# Patient Record
Sex: Female | Born: 1995
Health system: Southern US, Community
[De-identification: ages and names within clinical notes are randomized; demographics above are authoritative.]

## PROBLEM LIST (undated history)

## (undated) DIAGNOSIS — E559 Vitamin D deficiency, unspecified: Secondary | ICD-10-CM

## (undated) DIAGNOSIS — T7840XA Allergy, unspecified, initial encounter: Secondary | ICD-10-CM

## (undated) DIAGNOSIS — F419 Anxiety disorder, unspecified: Secondary | ICD-10-CM

## (undated) DIAGNOSIS — J302 Other seasonal allergic rhinitis: Secondary | ICD-10-CM

## (undated) DIAGNOSIS — E611 Iron deficiency: Secondary | ICD-10-CM

## (undated) DIAGNOSIS — D649 Anemia, unspecified: Secondary | ICD-10-CM

## (undated) HISTORY — DX: Anxiety disorder, unspecified: F41.9

## (undated) HISTORY — PX: WISDOM TOOTH EXTRACTION: SHX21

## (undated) HISTORY — DX: Iron deficiency: E61.1

## (undated) HISTORY — DX: Allergy, unspecified, initial encounter: T78.40XA

## (undated) HISTORY — DX: Anemia, unspecified: D64.9

---
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
## 2014-06-09 ENCOUNTER — Emergency Department
Admit: 2014-06-09 | Disposition: A | Discharge status: Home / Self Care | Payer: Self-pay | Admitting: Emergency Medicine

## 2018-07-29 ENCOUNTER — Other Ambulatory Visit: Payer: Self-pay

## 2018-07-29 ENCOUNTER — Encounter: Payer: Self-pay | Admitting: Certified Nurse Midwife

## 2018-07-29 ENCOUNTER — Ambulatory Visit (INDEPENDENT_AMBULATORY_CARE_PROVIDER_SITE_OTHER): Payer: BC Managed Care – PPO | Admitting: Certified Nurse Midwife

## 2018-07-29 VITALS — BP 105/70 | HR 72 | Ht 64.0 in | Wt 139.2 lb

## 2018-07-29 DIAGNOSIS — Z124 Encounter for screening for malignant neoplasm of cervix: Secondary | ICD-10-CM

## 2018-07-29 DIAGNOSIS — Z01419 Encounter for gynecological examination (general) (routine) without abnormal findings: Secondary | ICD-10-CM

## 2018-07-29 NOTE — Progress Notes (Signed)
GYNECOLOGY ANNUAL PREVENTATIVE CARE ENCOUNTER NOTE  History:     Connie Martinez is a 23 y.o. No obstetric history on file. female here for a routine annual gynecologic exam.  Current complaints: Nuva ring sometimes falls out. [name].   Denies abnormal vaginal bleeding, discharge, pelvic pain, problems with intercourse or other gynecologic concerns.    Gynecologic History No LMP recorded. Contraception: NuvaRing vaginal inserts Last Pap: Never had   Last mammogram: n/a.   Obstetric History OB History  No obstetric history on file.    Past Medical History:  Diagnosis Date  . Seasonal allergies   . Vitamin D deficiency     Past Surgical History:  Procedure Laterality Date  . WISDOM TOOTH EXTRACTION      Current Outpatient Medications on File Prior to Visit  Medication Sig Dispense Refill  . cetirizine (ZYRTEC) 10 MG tablet Take 10 mg by mouth daily.    Marland Kitchen. dimenhyDRINATE (DRAMAMINE) 50 MG tablet Take 50 mg by mouth every 8 (eight) hours as needed (for nausea).    . ondansetron (ZOFRAN) 4 MG tablet Take 1 tablet (4 mg total) by mouth every 6 (six) hours. 12 tablet 0  . Vitamin D, Ergocalciferol, (DRISDOL) 50000 UNITS CAPS Take 50,000 Units by mouth every 7 (seven) days.     No current facility-administered medications on file prior to visit.     No Known Allergies  Social History:  reports that she has never smoked. She has never used smokeless tobacco. She reports that she does not drink alcohol or use drugs.  No family history on file.  The following portions of the patient's history were reviewed and updated as appropriate: allergies, current medications, past family history, past medical history, past social history, past surgical history and problem list.  Review of Systems Pertinent items noted in HPI and remainder of comprehensive ROS otherwise negative.  Physical Exam:  There were no vitals taken for this visit. CONSTITUTIONAL:  Well-developed, well-nourished female in no acute distress.  HENT:  Normocephalic, atraumatic, External right and left ear normal. Oropharynx is clear and moist EYES: Conjunctivae and EOM are normal. Pupils are equal, round, and reactive to light. No scleral icterus.  NECK: Normal range of motion, supple, no masses.  Normal thyroid.  SKIN: Skin is warm and dry. No rash noted. Not diaphoretic. No erythema. No pallor. MUSCULOSKELETAL: Normal range of motion. No tenderness.  No cyanosis, clubbing, or edema.  2+ distal pulses. NEUROLOGIC: Alert and oriented to person, place, and time. Normal reflexes, muscle tone coordination. No cranial nerve deficit noted. PSYCHIATRIC: Normal mood and affect. Normal behavior. Normal judgment and thought content. CARDIOVASCULAR: Normal heart rate noted, regular rhythm RESPIRATORY: Clear to auscultation bilaterally. Effort and breath sounds normal, no problems with respiration noted. BREASTS: Symmetric in size. No masses, skin changes, nipple drainage, or lymphadenopathy. ABDOMEN: Soft, normal bowel sounds, no distention noted.  No tenderness, rebound or guarding.  PELVIC: Normal appearing external genitalia; normal appearing vaginal mucosa and cervix.  No abnormal discharge noted.  Pap smear obtained.  Normal uterine size, no other palpable masses, no uterine or adnexal tenderness.   Assessment and Plan:   Annual well women's GYN exam  Will follow up results of pap smear and manage accordingly. Mammogram not indicated Labs: Lipid profile, TSH, pap smear Routine preventative health maintenance measures emphasized. Please refer to After Visit Summary for other counseling recommendations.    Reviewed BC options. Benefits, risks, and procedures for placement.  Follow up for IUD/nexplanon as desired.  Philip Aspen, CNM

## 2018-07-29 NOTE — Patient Instructions (Signed)
Preventive Care 18-39 Years, Female Preventive care refers to lifestyle choices and visits with your health care provider that can promote health and wellness. What does preventive care include?   A yearly physical exam. This is also called an annual well check.  Dental exams once or twice a year.  Routine eye exams. Ask your health care provider how often you should have your eyes checked.  Personal lifestyle choices, including: ? Daily care of your teeth and gums. ? Regular physical activity. ? Eating a healthy diet. ? Avoiding tobacco and drug use. ? Limiting alcohol use. ? Practicing safe sex. ? Taking vitamin and mineral supplements as recommended by your health care provider. What happens during an annual well check? The services and screenings done by your health care provider during your annual well check will depend on your age, overall health, lifestyle risk factors, and family history of disease. Counseling Your health care provider may ask you questions about your:  Alcohol use.  Tobacco use.  Drug use.  Emotional well-being.  Home and relationship well-being.  Sexual activity.  Eating habits.  Work and work environment.  Method of birth control.  Menstrual cycle.  Pregnancy history. Screening You may have the following tests or measurements:  Height, weight, and BMI.  Diabetes screening. This is done by checking your blood sugar (glucose) after you have not eaten for a while (fasting).  Blood pressure.  Lipid and cholesterol levels. These may be checked every 5 years starting at age 20.  Skin check.  Hepatitis C blood test.  Hepatitis B blood test.  Sexually transmitted disease (STD) testing.  BRCA-related cancer screening. This may be done if you have a family history of breast, ovarian, tubal, or peritoneal cancers.  Pelvic exam and Pap test. This may be done every 3 years starting at age 21. Starting at age 30, this may be done every 5  years if you have a Pap test in combination with an HPV test. Discuss your test results, treatment options, and if necessary, the need for more tests with your health care provider. Vaccines Your health care provider may recommend certain vaccines, such as:  Influenza vaccine. This is recommended every year.  Tetanus, diphtheria, and acellular pertussis (Tdap, Td) vaccine. You may need a Td booster every 10 years.  Varicella vaccine. You may need this if you have not been vaccinated.  HPV vaccine. If you are 26 or younger, you may need three doses over 6 months.  Measles, mumps, and rubella (MMR) vaccine. You may need at least one dose of MMR. You may also need a second dose.  Pneumococcal 13-valent conjugate (PCV13) vaccine. You may need this if you have certain conditions and were not previously vaccinated.  Pneumococcal polysaccharide (PPSV23) vaccine. You may need one or two doses if you smoke cigarettes or if you have certain conditions.  Meningococcal vaccine. One dose is recommended if you are age 19-21 years and a first-year college student living in a residence hall, or if you have one of several medical conditions. You may also need additional booster doses.  Hepatitis A vaccine. You may need this if you have certain conditions or if you travel or work in places where you may be exposed to hepatitis A.  Hepatitis B vaccine. You may need this if you have certain conditions or if you travel or work in places where you may be exposed to hepatitis B.  Haemophilus influenzae type b (Hib) vaccine. You may need this if you   have certain risk factors. Talk to your health care provider about which screenings and vaccines you need and how often you need them. This information is not intended to replace advice given to you by your health care provider. Make sure you discuss any questions you have with your health care provider. Document Released: 03/28/2001 Document Revised: 09/12/2016  Document Reviewed: 12/01/2014 Elsevier Interactive Patient Education  2019 [employer].

## 2018-07-30 LAB — LIPID PANEL
Chol/HDL Ratio: 3.2 ratio (ref 0.0–4.4)
Cholesterol, Total: 209 mg/dL — ABNORMAL HIGH (ref 100–199)
HDL: 65 mg/dL (ref >39)
LDL Calculated: 124 mg/dL — ABNORMAL HIGH (ref 0–99)
Triglycerides: 100 mg/dL (ref 0–149)
VLDL Cholesterol Cal: 20 mg/dL (ref 5–40)

## 2018-07-30 LAB — THYROID PANEL WITH TSH
Free Thyroxine Index: 2.3 (ref 1.2–4.9)
T3 Uptake Ratio: 22 % — ABNORMAL LOW (ref 24–39)
T4, Total: 10.4 ug/dL (ref 4.5–12.0)
TSH: 1.38 u[IU]/mL (ref 0.450–4.500)

## 2018-08-02 ENCOUNTER — Telehealth: Payer: Self-pay

## 2018-08-02 LAB — PAP IG, CT-NG, RFX HPV ASCU
Chlamydia, Nuc. Acid Amp: NEGATIVE
Gonococcus by Nucleic Acid Amp: NEGATIVE

## 2018-08-02 NOTE — Telephone Encounter (Signed)
Coronavirus (COVID-19) Are you at risk?  Are you at risk for the Coronavirus (COVID-19)?  To be considered HIGH RISK for Coronavirus (COVID-19), you have to meet the following criteria:  . Traveled to [place], [place], [place], [place] or [place]; or in the [place] to [place], [place], [place], or [place]; and have fever, cough, and shortness of breath within the last 2 weeks of travel OR . Been in close contact with a person diagnosed with COVID-19 within the last 2 weeks and have fever, cough, and shortness of breath . IF YOU DO NOT MEET THESE CRITERIA, YOU ARE CONSIDERED LOW RISK FOR COVID-19.  What to do if you are HIGH RISK for COVID-19?  . If you are having a medical emergency, call 911. . Seek medical care right away. Before you go to a doctor's office, urgent care or emergency department, call ahead and tell them about your recent travel, contact with someone diagnosed with COVID-19, and your symptoms. You should receive instructions from your physician's office regarding next steps of care.  . When you arrive at healthcare provider, tell the healthcare staff immediately you have returned from visiting [place], [place], [place], [place] or [place]; or traveled in the [place] to [place], [place], [place], or [place]; in the last two weeks or you have been in close contact with a person diagnosed with COVID-19 in the last 2 weeks.   . Tell the health care staff about your symptoms: fever, cough and shortness of breath. . After you have been seen by a medical provider, you will be either: o Tested for (COVID-19) and discharged home on quarantine except to seek medical care if symptoms worsen, and asked to  - Stay home and avoid contact with others until you get your results (4-5 days)  - Avoid travel on public transportation if possible (such as bus, train, or airplane) or o Sent to the Emergency Department by EMS for evaluation, COVID-19 testing, and possible  admission depending on your condition and test results.  What to do if you are LOW RISK for COVID-19?  Reduce your risk of any infection by using the same precautions used for avoiding the common cold or flu:  . Wash your hands often with soap and warm water for at least 20 seconds.  If soap and water are not readily available, use an alcohol-based hand sanitizer with at least 60% alcohol.  . If coughing or sneezing, cover your mouth and nose by coughing or sneezing into the elbow areas of your shirt or coat, into a tissue or into your sleeve (not your hands). . Avoid shaking hands with others and consider head nods or verbal greetings only. . Avoid touching your eyes, nose, or mouth with unwashed hands.  . Avoid close contact with people who are sick. . Avoid places or events with large numbers of people in one location, like concerts or sporting events. . Carefully consider travel plans you have or are making. . If you are planning any travel outside or inside the [place], visit the CDC's Travelers' Health webpage for the latest health notices. . If you have some symptoms but not all symptoms, continue to monitor at home and seek medical attention if your symptoms worsen. . If you are having a medical emergency, call 911.   ADDITIONAL HEALTHCARE OPTIONS FOR PATIENTS  [facility] / e-Visit: [email or web address]         [facility]: [phone]  [facility]: [phone]                   [facility]: [phone]   Pre-screen negative, DM.   

## 2018-08-02 NOTE — Telephone Encounter (Signed)
Pt called for prescreening no answer unable to leave message due to mailbox being full. Sent [employer].

## 2018-08-05 ENCOUNTER — Other Ambulatory Visit: Payer: Self-pay

## 2018-08-05 ENCOUNTER — Ambulatory Visit (INDEPENDENT_AMBULATORY_CARE_PROVIDER_SITE_OTHER): Payer: BC Managed Care – PPO | Admitting: Certified Nurse Midwife

## 2018-08-05 ENCOUNTER — Encounter: Payer: Self-pay | Admitting: Certified Nurse Midwife

## 2018-08-05 VITALS — BP 109/65 | HR 73 | Ht 64.0 in | Wt 138.6 lb

## 2018-08-05 DIAGNOSIS — Z3202 Encounter for pregnancy test, result negative: Secondary | ICD-10-CM

## 2018-08-05 DIAGNOSIS — Z30430 Encounter for insertion of intrauterine contraceptive device: Secondary | ICD-10-CM | POA: Diagnosis not present

## 2018-08-05 LAB — POCT URINE PREGNANCY: Preg Test, Ur: NEGATIVE

## 2018-08-05 NOTE — Patient Instructions (Signed)
Intrauterine Device Insertion, Care After    This sheet gives you information about how to care for yourself after your procedure. Your health care provider may also give you more specific instructions. If you have problems or questions, contact your health care provider.  What can I expect after the procedure?  After the procedure, it is common to have:  · Cramps and pain in the abdomen.  · Light bleeding (spotting) or heavier bleeding that is like your menstrual period. This may last for up to a few days.  · Lower back pain.  · Dizziness.  · Headaches.  · Nausea.  Follow these instructions at home:  · Before resuming sexual activity, check to make sure that you can feel the IUD string(s). You should be able to feel the end of the string(s) below the opening of your cervix. If your IUD string is in place, you may resume sexual activity.  ? If you had a hormonal IUD inserted more than 7 days after your most recent period started, you will need to use a backup method of birth control for 7 days after IUD insertion. Ask your health care provider whether this applies to you.  · Continue to check that the IUD is still in place by feeling for the string(s) after every menstrual period, or once a month.  · Take over-the-counter and prescription medicines only as told by your health care provider.  · Do not drive or use heavy machinery while taking prescription pain medicine.  · Keep all follow-up visits as told by your health care provider. This is important.  Contact a health care provider if:  · You have bleeding that is heavier or lasts longer than a normal menstrual cycle.  · You have a fever.  · You have cramps or abdominal pain that get worse or do not get better with medicine.  · You develop abdominal pain that is new or is not in the same area of earlier cramping and pain.  · You feel lightheaded or weak.  · You have abnormal or bad-smelling discharge from your vagina.  · You have pain during sexual  activity.  · You have any of the following problems with your IUD string(s):  ? The string bothers or hurts you or your sexual partner.  ? You cannot feel the string.  ? The string has gotten longer.  · You can feel the IUD in your vagina.  · You think you may be pregnant, or you miss your menstrual period.  · You think you may have an STI (sexually transmitted infection).  Get help right away if:  · You have flu-like symptoms.  · You have a fever and chills.  · You can feel that your IUD has slipped out of place.  Summary  · After the procedure, it is common to have cramps and pain in the abdomen. It is also common to have light bleeding (spotting) or heavier bleeding that is like your menstrual period.  · Continue to check that the IUD is still in place by feeling for the string(s) after every menstrual period, or once a month.  · Keep all follow-up visits as told by your health care provider. This is important.  · Contact your health care provider if you have problems with your IUD string(s), such as the string getting longer or bothering you or your sexual partner.  This information is not intended to replace advice given to you by your health care provider. Make   sure you discuss any questions you have with your health care provider.  Document Released: 09/28/2010 Document Revised: 12/22/2015 Document Reviewed: 12/22/2015  Elsevier Interactive Patient Education © 2019 Elsevier Inc.

## 2018-08-05 NOTE — Progress Notes (Signed)
    GYNECOLOGY OFFICE PROCEDURE NOTE  ALY SEIDENBERG is a 23 y.o. G0P0000 here for Mirena IUD insertion. No GYN concerns.  Last pap smear was on 07/29/18  and was normal.  IUD Insertion Procedure Note Patient identified, informed consent performed, consent signed.   Discussed risks of irregular bleeding, cramping, infection, malpositioning or misplacement of the IUD outside the uterus which may require further procedure such as laparoscopy. Time out was performed.  Urine pregnancy test negative.  Speculum placed in the vagina.  Cervix visualized.  Cleaned with Betadine x 2.  Grasped anteriorly with a single tooth tenaculum.  Uterus sounded to 3 cm.  Mirena IUD placed per manufacturer's recommendations.  Strings trimmed to 3 cm. Tenaculum was removed, good hemostasis noted.  Patient passed out , but came too very quickly with cool cloth . She was given juice and cool clothes and remained lying down for several minutes until color returned. Pt was advised to call to have someone drive her home. She agreed and called her boyfriend.   Patient was given post-procedure instructions.  She was advised to have backup contraception for one week.  Patient was also asked to check IUD strings periodically and follow up in 4 weeks for IUD check.   Philip Aspen, CNM

## 2018-09-02 ENCOUNTER — Encounter: Payer: Self-pay | Admitting: Certified Nurse Midwife

## 2018-09-02 ENCOUNTER — Ambulatory Visit: Payer: BC Managed Care – PPO | Admitting: Certified Nurse Midwife

## 2018-09-02 ENCOUNTER — Other Ambulatory Visit: Payer: Self-pay

## 2018-09-02 VITALS — BP 116/68 | HR 77 | Ht 64.0 in | Wt 141.0 lb

## 2018-09-02 DIAGNOSIS — R350 Frequency of micturition: Secondary | ICD-10-CM | POA: Diagnosis not present

## 2018-09-02 NOTE — Patient Instructions (Signed)

## 2018-09-02 NOTE — Progress Notes (Signed)
  GYNECOLOGY OFFICE ENCOUNTER NOTE  History:  23 y.o. G0P0000 here today for today for IUD string check; Mirena  IUD was placed  08/05/18. No complaints about the IUD, no concerning side effects. She states she was treated recently for UTI and has a history of uti. She would like urine checked.   The following portions of the patient's history were reviewed and updated as appropriate: allergies, current medications, past family history, past medical history, past social history, past surgical history and problem list. Last pap smear on 07/29/18 was normal, negative HRHPV.  Review of Systems:  Pertinent items are noted in HPI.   Objective:  Physical Exam There were no vitals taken for this visit. CONSTITUTIONAL: Well-developed, well-nourished female in no acute distress.  HENT:  Normocephalic, atraumatic. External right and left ear normal. Oropharynx is clear and moist EYES: Conjunctivae and EOM are normal. Pupils are equal, round, and reactive to light. No scleral icterus.  NECK: Normal range of motion, supple, no masses CARDIOVASCULAR: Normal heart rate noted RESPIRATORY: Effort and breath sounds normal, no problems with respiration noted ABDOMEN: Soft, no distention noted.   PELVIC: Normal appearing external genitalia; normal appearing vaginal mucosa and cervix.  IUD strings visualized, about 3 cm in length outside cervix.   Assessment & Plan:  Patient to keep IUD in place for up to seven years; can come in for removal if she desires pregnancy earlier or for any concerning side effects. Urine culture sent. Will follow up with resutls. Return PRN or for annual exam.   Philip Aspen, CNM

## 2018-09-04 LAB — URINE CULTURE

## 2019-06-05 ENCOUNTER — Ambulatory Visit: Payer: BC Managed Care – PPO | Admitting: Adult Health

## 2019-06-30 ENCOUNTER — Encounter: Payer: Self-pay | Admitting: Adult Health

## 2019-06-30 ENCOUNTER — Ambulatory Visit (INDEPENDENT_AMBULATORY_CARE_PROVIDER_SITE_OTHER): Payer: BC Managed Care – PPO | Admitting: Adult Health

## 2019-06-30 ENCOUNTER — Other Ambulatory Visit: Payer: Self-pay

## 2019-06-30 VITALS — BP 116/82 | HR 79 | Temp 97.5°F | Ht 63.0 in | Wt 145.4 lb

## 2019-06-30 DIAGNOSIS — Z13220 Encounter for screening for lipoid disorders: Secondary | ICD-10-CM

## 2019-06-30 DIAGNOSIS — Z1329 Encounter for screening for other suspected endocrine disorder: Secondary | ICD-10-CM

## 2019-06-30 DIAGNOSIS — Z1389 Encounter for screening for other disorder: Secondary | ICD-10-CM | POA: Diagnosis not present

## 2019-06-30 DIAGNOSIS — Z8639 Personal history of other endocrine, nutritional and metabolic disease: Secondary | ICD-10-CM

## 2019-06-30 DIAGNOSIS — Z0000 Encounter for general adult medical examination without abnormal findings: Secondary | ICD-10-CM | POA: Diagnosis not present

## 2019-06-30 DIAGNOSIS — R5383 Other fatigue: Secondary | ICD-10-CM | POA: Diagnosis not present

## 2019-06-30 DIAGNOSIS — K5900 Constipation, unspecified: Secondary | ICD-10-CM

## 2019-06-30 DIAGNOSIS — E559 Vitamin D deficiency, unspecified: Secondary | ICD-10-CM | POA: Insufficient documentation

## 2019-06-30 LAB — POCT URINALYSIS DIPSTICK
Bilirubin, UA: NEGATIVE
Blood, UA: NEGATIVE
Glucose, UA: NEGATIVE
Ketones, UA: NEGATIVE
Leukocytes, UA: NEGATIVE
Nitrite, UA: NEGATIVE
Protein, UA: NEGATIVE
Spec Grav, UA: 1.01 (ref 1.010–1.025)
Urobilinogen, UA: 0.2 E.U./dL
pH, UA: 5 (ref 5.0–8.0)

## 2019-06-30 NOTE — Progress Notes (Signed)
New patient visit   Patient: Connie Martinez   DOB: Jun 11, 1995   24 y.o. Female  MRN: [id number] Visit Date: 06/30/2019  Today's healthcare provider: Jairo BenMichelle Smith Brent Noto, FNP   Chief Complaint  Patient presents with  . New Patient (Initial Visit)  I,Porsha C McClurkin,acting as a scribe for Pacific MutualMichelle Smith Selene Peltzer, FNP.,have documented all relevant documentation on the behalf of Jairo BenMichelle Smith Rhylee Nunn, FNP,as directed by  Jairo BenMichelle Smith Alaster Asfaw, FNP while in the presence of Jairo BenMichelle Smith Emry Tobin, FNP.  Subjective    Connie Martinez is a 24 y.o. female who presents today as a new patient to establish care.  HPI  Patient states she has anxiety and is currently taking Ashwagandha to help. She states that the medication does help a little.She feels this helping her. She did an English as a second language teacheronline physician and he gave her Cymbalta and it made her so sleepy she stopped.   She has been having some constipation symptoms, she has increased her water to 1.5 gallon a day. She tries to eat a lot of fiber. She denies any rectal pain currently.She denies any bleeding. Denies any sores or lesions.   Still taking Iron - iron deficiency- eats little red meat   Patient states that since get her mirena place 08/05/2018 she is having frequent UTI's. She states that she is takings the cranberry for it or AZO. She was having symptoms of painful urination and burning starting the first two months after the IUD. She has had no symptoms since March.  She is up to date on PAP smear -  June 2020. She is due for pap June 2023. She is going for her annual with them as well.   She does notice itching after intercourse and these symptoms increasing after. Denies any abnormal vaginal discharge.   Patient  denies any fever, body aches,chills, rash, chest pain, shortness of breath, nausea, vomiting, or diarrhea.  Denies dizziness, lightheadedness, pre syncopal or syncopal episodes.   Past Medical History:    Diagnosis Date  . Allergy   . Anemia   . Anxiety   . Iron deficiency   . Seasonal allergies   . Vitamin D deficiency    Past Surgical History:  Procedure Laterality Date  . WISDOM TOOTH EXTRACTION     Family Status  Relation Name Status  . Mother  (Not Specified)  . Father  (Not Specified)  . MGM  (Not Specified)  . MGF  (Not Specified)  . PGM  (Not Specified)   Family History  Problem Relation Age of Onset  . Thyroid disease Mother   . Hypertension Mother   . Hyperlipidemia Mother   . Thyroid disease Father   . Sleep apnea Father   . Hypertension Father   . Hyperlipidemia Maternal Grandmother   . Arthritis Maternal Grandmother   . Hypertension Maternal Grandmother   . Arthritis Maternal Grandfather   . Diabetes Paternal Grandmother   . COPD Paternal Grandmother    Social History   Socioeconomic History  . Marital status: Married    Spouse name: Not on file  . Number of children: Not on file  . Years of education: Not on file  . Highest education level: Not on file  Occupational History  . Not on file  Tobacco Use  . Smoking status: Never Smoker  . Smokeless tobacco: Never Used  Substance and Sexual Activity  . Alcohol use: No  . Drug use: No  . Sexual activity: Yes  Birth control/protection: I.U.D.  Other Topics Concern  . Not on file  Social History Narrative  . Not on file   Social Determinants of Health   Financial Resource Strain:   . Difficulty of Paying Living Expenses:   Food Insecurity:   . Worried About Programme researcher, broadcasting/film/video in the Last Year:   . Barista in the Last Year:   Transportation Needs:   . Freight forwarder (Medical):   Marland Kitchen Lack of Transportation (Non-Medical):   Physical Activity:   . Days of Exercise per Week:   . Minutes of Exercise per Session:   Stress:   . Feeling of Stress :   Social Connections:   . Frequency of Communication with Friends and Family:   . Frequency of Social Gatherings with Friends and  Family:   . Attends Religious Services:   . Active Member of Clubs or Organizations:   . Attends Banker Meetings:   Marland Kitchen Marital Status:    Outpatient Medications Prior to Visit  Medication Sig  . ASHWAGANDHA PO Take by mouth.  . cetirizine (ZYRTEC) 10 MG tablet Take 10 mg by mouth daily.  . Cranberry-Vitamin C-Vitamin E (CRANBERRY PLUS VITAMIN C) 4200-20-3 MG-MG-UNIT CAPS Take by mouth once.  . ferrous sulfate 325 (65 FE) MG tablet Take 325 mg by mouth daily with breakfast.  . levonorgestrel (MIRENA) 20 MCG/24HR IUD 1 each by Intrauterine route once.  . Vitamin D, Ergocalciferol, (DRISDOL) 50000 UNITS CAPS Take 50,000 Units by mouth every 7 (seven) days.  Marland Kitchen dimenhyDRINATE (DRAMAMINE) 50 MG tablet Take 50 mg by mouth every 8 (eight) hours as needed (for nausea).   No facility-administered medications prior to visit.   No Known Allergies   There is no immunization history on file for this patient.  Health Maintenance  Topic Date Due  . HIV Screening  Never done  . TETANUS/TDAP  Never done  . INFLUENZA VACCINE  09/14/2019  . PAP-Cervical Cytology Screening  07/28/2021  . PAP SMEAR-Modifier  07/28/2021    Patient Care Team: Berniece Pap, FNP as PCP - General (Family Medicine)  Review of Systems  Constitutional: Negative.   HENT: Negative.   Eyes: Negative.   Respiratory: Negative.   Cardiovascular: Negative.   Gastrointestinal: Negative.   Endocrine: Negative.   Genitourinary: Negative.        Constipation at times  No urinary symptoms since march  2021 she reports   Musculoskeletal: Negative.   Skin: Negative.   Allergic/Immunologic: Negative.   Neurological: Negative.   Hematological: Negative.   Psychiatric/Behavioral: Negative.     Last CBC Lab Results  Component Value Date   WBC 9.5 05/07/2012   HGB 11.3 (L) 05/07/2012   HCT 32.9 (L) 05/07/2012   MCV 90.6 05/07/2012   MCH 31.1 05/07/2012   RDW 12.9 05/07/2012   PLT 153 05/07/2012    Last metabolic panel Lab Results  Component Value Date   GLUCOSE 172 (H) 05/06/2012   NA 139 05/06/2012   K 4.3 05/06/2012   CL 101 05/06/2012   CO2 25 05/06/2012   BUN 15 05/06/2012   CREATININE 0.67 05/07/2012   GFRNONAA >90 05/07/2012   GFRAA >90 05/07/2012   CALCIUM 9.0 05/06/2012   Last lipids Lab Results  Component Value Date   CHOL 209 (H) 07/29/2018   HDL 65 07/29/2018   LDLCALC 124 (H) 07/29/2018   TRIG 100 07/29/2018   CHOLHDL 3.2 07/29/2018   Last hemoglobin A1c No results  found for: HGBA1C Last thyroid functions Lab Results  Component Value Date   TSH 1.380 07/29/2018   T4TOTAL 10.4 07/29/2018   Last vitamin D No results found for: 25OHVITD2, 25OHVITD3, VD25OH Last vitamin B12 and Folate No results found for: VITAMINB12, FOLATE    Objective    BP 116/82 (BP Location: Right Arm, Patient Position: Sitting, Cuff Size: Normal)   Pulse 79   Temp (!) 97.5 F (36.4 C) (Temporal)   Ht 5\' 3"  (1.6 m)   Wt 145 lb 6.4 oz (66 kg)   SpO2 96%   BMI 25.76 kg/m  Physical Exam Vitals reviewed.  Constitutional:      General: She is not in acute distress.    Appearance: Normal appearance. She is well-developed. She is not ill-appearing, toxic-appearing or diaphoretic.     Interventions: She is not intubated. HENT:     Head: Normocephalic and atraumatic.     Right Ear: Tympanic membrane, ear canal and external ear normal. There is no impacted cerumen.     Left Ear: Tympanic membrane, ear canal and external ear normal. There is no impacted cerumen.     Nose: Nose normal. No congestion or rhinorrhea.     Mouth/Throat:     Mouth: Mucous membranes are moist.     Pharynx: No oropharyngeal exudate or posterior oropharyngeal erythema.  Eyes:     General: Lids are normal. No scleral icterus.       Right eye: No discharge.        Left eye: No discharge.     Conjunctiva/sclera: Conjunctivae normal.     Right eye: Right conjunctiva is not injected. No exudate or  hemorrhage.    Left eye: Left conjunctiva is not injected. No exudate or hemorrhage.    Pupils: Pupils are equal, round, and reactive to light.  Neck:     Thyroid: No thyroid mass or thyromegaly.     Vascular: Normal carotid pulses. No carotid bruit, hepatojugular reflux or JVD.     Trachea: Trachea and phonation normal. No tracheal tenderness or tracheal deviation.     Meningeal: Brudzinski's sign and Kernig's sign absent.  Cardiovascular:     Rate and Rhythm: Normal rate and regular rhythm.     Pulses: Normal pulses.          Radial pulses are 2+ on the right side and 2+ on the left side.       Dorsalis pedis pulses are 2+ on the right side and 2+ on the left side.       Posterior tibial pulses are 2+ on the right side and 2+ on the left side.     Heart sounds: Normal heart sounds, S1 normal and S2 normal. Heart sounds not distant. No murmur. No friction rub. No gallop.   Pulmonary:     Effort: Pulmonary effort is normal. No tachypnea, bradypnea, accessory muscle usage or respiratory distress. She is not intubated.     Breath sounds: Normal breath sounds. No stridor. No wheezing, rhonchi or rales.  Chest:     Chest wall: No tenderness.  Abdominal:     General: Bowel sounds are normal. There is no distension or abdominal bruit.     Palpations: Abdomen is soft. There is no shifting dullness, fluid wave, hepatomegaly, splenomegaly, mass or pulsatile mass.     Tenderness: There is no abdominal tenderness. There is no right CVA tenderness, left CVA tenderness, guarding or rebound.     Hernia: No hernia is present.  Genitourinary:  Comments: deferred to gynecology  Musculoskeletal:        General: No swelling, tenderness, deformity or signs of injury. Normal range of motion.     Cervical back: Full passive range of motion without pain, normal range of motion and neck supple. No edema, erythema, rigidity or tenderness. No spinous process tenderness or muscular tenderness. Normal range of  motion.     Right lower leg: No edema.     Left lower leg: No edema.  Lymphadenopathy:     Head:     Right side of head: No submental, submandibular, tonsillar, preauricular, posterior auricular or occipital adenopathy.     Left side of head: No submental, submandibular, tonsillar, preauricular, posterior auricular or occipital adenopathy.     Cervical: No cervical adenopathy.     Right cervical: No superficial, deep or posterior cervical adenopathy.    Left cervical: No superficial, deep or posterior cervical adenopathy.     Upper Body:     Right upper body: No supraclavicular or pectoral adenopathy.     Left upper body: No supraclavicular or pectoral adenopathy.  Skin:    General: Skin is warm and dry.     Capillary Refill: Capillary refill takes less than 2 seconds.     Coloration: Skin is not jaundiced or pale.     Findings: No abrasion, bruising, burn, ecchymosis, erythema, lesion, petechiae or rash.     Nails: There is no clubbing.  Neurological:     General: No focal deficit present.     Mental Status: She is alert and oriented to person, place, and time.     GCS: GCS eye subscore is 4. GCS verbal subscore is 5. GCS motor subscore is 6.     Cranial Nerves: No cranial nerve deficit.     Sensory: No sensory deficit.     Motor: No weakness, tremor, atrophy, abnormal muscle tone or seizure activity.     Coordination: Coordination normal.     Gait: Gait normal.     Deep Tendon Reflexes: Reflexes are normal and symmetric. Reflexes normal. Babinski sign absent on the right side. Babinski sign absent on the left side.     Reflex Scores:      Tricep reflexes are 2+ on the right side and 2+ on the left side.      Bicep reflexes are 2+ on the right side and 2+ on the left side.      Brachioradialis reflexes are 2+ on the right side and 2+ on the left side.      Patellar reflexes are 2+ on the right side and 2+ on the left side.      Achilles reflexes are 2+ on the right side and 2+ on  the left side. Psychiatric:        Mood and Affect: Mood normal.        Speech: Speech normal.        Behavior: Behavior normal.        Thought Content: Thought content normal.        Judgment: Judgment normal.     Depression Screen PHQ 2/9 Scores 06/30/2019  PHQ - 2 Score 0  PHQ- 9 Score 3   No results found for any visits on 06/30/19.  Assessment & Plan        Routine health maintenance  Fatigue, unspecified type - Plan: CBC with Differential/Platelet, Comprehensive metabolic panel  Vitamin D deficiency - Plan: VITAMIN D 25 Hydroxy (Vit-D Deficiency, Fractures)  Screening cholesterol level -  Plan: Lipid panel  Screening for blood or protein in urine - Plan: POCT urinalysis dipstick  Constipation, unspecified constipation type  Screening for thyroid disorder - Plan: TSH  History of iron deficiency   Return in about 1 month (around 07/31/2019), or if symptoms worsen or fail to improve, for at any time for any worsening symptoms, Go to Emergency room/ urgent care if worse.     No orders of the defined types were placed in this encounter.  Will try psyllium husks per package instructions for 1 month and see if constipation improved - mix with liquid as per package and add fiber to diet and water daily.  Return if red flags or any symptoms worsening.   Results for orders placed or performed in visit on 06/30/19 (from the past 24 hour(s))  POCT urinalysis dipstick     Status: None   Collection Time: 06/30/19 11:41 AM  Result Value Ref Range   Color, UA Yellow    Clarity, UA Clear    Glucose, UA Negative Negative   Bilirubin, UA Negative    Ketones, UA Negative    Spec Grav, UA 1.010 1.010 - 1.025   Blood, UA Negative    pH, UA 5.0 5.0 - 8.0   Protein, UA Negative Negative   Urobilinogen, UA 0.2 0.2 or 1.0 E.U./dL   Nitrite, UA Negative    Leukocytes, UA Negative Negative   Appearance     Odor      Orders Placed This Encounter  Procedures  . CBC with  Differential/Platelet  . Comprehensive metabolic panel  . Lipid panel  . TSH  . VITAMIN D 25 Hydroxy (Vit-D Deficiency, Fractures)    [employer] employee  [phone]  . POCT urinalysis dipstick   Advised patient call the office or your primary care doctor for an appointment if no improvement within 72 hours or if any symptoms change or worsen at any time  Advised ER or urgent Care if after hours or on weekend. Call 911 for emergency symptoms at any time.Patinet verbalized understanding of all instructions given/reviewed and treatment plan and has no further questions or concerns at this time.     IWellington [place] Noriko Macari, FNP, have reviewed all documentation for this visit. The documentation on 06/30/19 for the exam, diagnosis, procedures, and orders are all accurate and complete.   Marcille Buffy, [place] [phone] (phone) [phone] (fax)  [place]

## 2019-06-30 NOTE — Patient Instructions (Signed)
Psyllium granules or powder for solution What is this medicine? PSYLLIUM (SIL i yum) is a bulk-forming fiber laxative. This medicine is used to treat constipation. Increasing fiber in the diet may also help lower cholesterol and promote heart health for some people. This medicine may be used for other purposes; ask your health care provider or pharmacist if you have questions. COMMON BRAND NAME(S): Fiber Therapy, GenFiber, Geri-Mucil, Hydrocil, Konsyl, Metamucil, Metamucil MultiHealth, Mucilin, Natural Fiber Therapy, Reguloid What should I tell my health care provider before I take this medicine? They need to know if you have any of these conditions:  blockage in your bowel  difficulty swallowing  inflammatory bowel disease  phenylketonuria  stomach or intestine problems  sudden change in bowel habits lasting more than 2 weeks  an unusual or allergic reaction to psyllium, other medicines, dyes, or preservatives  pregnant or trying or get pregnant  breast-feeding How should I use this medicine? Mix this medicine into a full glass (240 mL) of water or other cool drink. Take this medicine by mouth. Follow the directions on the package labeling, or take as directed by your health care professional. Take your medicine at regular intervals. Do not take your medicine more often than directed. Talk to your pediatrician regarding the use of this medicine in children. While this drug may be prescribed for children as young as 45 years old for selected conditions, precautions do apply. Overdosage: If you think you have taken too much of this medicine contact a poison control center or emergency room at once. NOTE: This medicine is only for you. Do not share this medicine with others. What if I miss a dose? If you miss a dose, take it as soon as you can. If it is almost time for your next dose, take only that dose. Do not take double or extra doses. What may interact with this  medicine? Interactions are not expected. Take this product at least 2 hours before or after other medicines. This list may not describe all possible interactions. Give your health care provider a list of all the medicines, herbs, non-prescription drugs, or dietary supplements you use. Also tell them if you smoke, drink alcohol, or use illegal drugs. Some items may interact with your medicine. What should I watch for while using this medicine? Check with your doctor or health care professional if your symptoms do not start to get better or if they get worse. Stop using this medicine and contact your doctor or health care professional if you have rectal bleeding or if you have to treat your constipation for more than 1 week. These could be signs of a more serious condition. Drink several glasses of water a day while you are taking this medicine. This will help to relieve constipation and prevent dehydration. What side effects may I notice from receiving this medicine? Side effects that you should report to your doctor or health care professional as soon as possible:  allergic reactions like skin rash, itching or hives, swelling of the face, lips, or tongue  breathing problems  chest pain  nausea, vomiting  rectal bleeding  trouble swallowing Side effects that usually do not require medical attention (report to your doctor or health care professional if they continue or are bothersome):  bloating  gas  stomach cramps This list may not describe all possible side effects. Call your doctor for medical advice about side effects. You may report side effects to FDA at 1-800-FDA-1088. Where should I keep my medicine?  Keep out of the reach of children. Store at room temperature between 15 and 30 degrees C (59 and 86 degrees F). Protect from moisture. Throw away any unused medicine after the expiration date. NOTE: This sheet is a summary. It may not cover all possible information. If you have  questions about this medicine, talk to your doctor, pharmacist, or health care provider.  2020 Elsevier/Gold Standard (2017-06-26 15:41:08) Constipation, Adult Constipation is when a person:  Poops (has a bowel movement) fewer times in a week than normal.  Has a hard time pooping.  Has poop that is dry, hard, or bigger than normal. Follow these instructions at home: Eating and drinking   Eat foods that have a lot of fiber, such as: ? Fresh fruits and vegetables. ? Whole grains. ? Beans.  Eat less of foods that are high in fat, low in fiber, or overly processed, such as: ? [place] fries. ? Hamburgers. ? Cookies. ? Candy. ? Soda.  Drink enough fluid to keep your pee (urine) clear or pale yellow. General instructions  Exercise regularly or as told by your doctor.  Go to the restroom when you feel like you need to poop. Do not hold it in.  Take over-the-counter and prescription medicines only as told by your doctor. These include any fiber supplements.  Do pelvic floor retraining exercises, such as: ? Doing deep breathing while relaxing your lower belly (abdomen). ? Relaxing your pelvic floor while pooping.  Watch your condition for any changes.  Keep all follow-up visits as told by your doctor. This is important. Contact a doctor if:  You have pain that gets worse.  You have a fever.  You have not pooped for 4 days.  You throw up (vomit).  You are not hungry.  You lose weight.  You are bleeding from the anus.  You have thin, pencil-like poop (stool). Get help right away if:  You have a fever, and your symptoms suddenly get worse.  You leak poop or have blood in your poop.  Your belly feels hard or bigger than normal (is bloated).  You have very bad belly pain.  You feel dizzy or you faint. This information is not intended to replace advice given to you by your health care provider. Make sure you discuss any questions you have with your health care  provider. Document Revised: 01/12/2017 Document Reviewed: 07/21/2015 Elsevier Patient Education  2020 [employer]. Health Maintenance, Female Adopting a healthy lifestyle and getting preventive care are important in promoting health and wellness. Ask your health care provider about:  The right schedule for you to have regular tests and exams.  Things you can do on your own to prevent diseases and keep yourself healthy. What should I know about diet, weight, and exercise? Eat a healthy diet   Eat a diet that includes plenty of vegetables, fruits, low-fat dairy products, and lean protein.  Do not eat a lot of foods that are high in solid fats, added sugars, or sodium. Maintain a healthy weight Body mass index (BMI) is used to identify weight problems. It estimates body fat based on height and weight. Your health care provider can help determine your BMI and help you achieve or maintain a healthy weight. Get regular exercise Get regular exercise. This is one of the most important things you can do for your health. Most adults should:  Exercise for at least 150 minutes each week. The exercise should increase your heart rate and make you sweat (moderate-intensity  exercise).  Do strengthening exercises at least twice a week. This is in addition to the moderate-intensity exercise.  Spend less time sitting. Even light physical activity can be beneficial. Watch cholesterol and blood lipids Have your blood tested for lipids and cholesterol at 26 years of age, then have this test every 5 years. Have your cholesterol levels checked more often if:  Your lipid or cholesterol levels are high.  You are older than 26 years of age.  You are at high risk for heart disease. What should I know about cancer screening? Depending on your health history and family history, you may need to have cancer screening at various ages. This may include screening for:  Breast cancer.  Cervical  cancer.  Colorectal cancer.  Skin cancer.  Lung cancer. What should I know about heart disease, diabetes, and high blood pressure? Blood pressure and heart disease  High blood pressure causes heart disease and increases the risk of stroke. This is more likely to develop in people who have high blood pressure readings, are of African descent, or are overweight.  Have your blood pressure checked: ? Every 3-5 years if you are 59-54 years of age. ? Every year if you are 66 years old or older. Diabetes Have regular diabetes screenings. This checks your fasting blood sugar level. Have the screening done:  Once every three years after age 62 if you are at a normal weight and have a low risk for diabetes.  More often and at a younger age if you are overweight or have a high risk for diabetes. What should I know about preventing infection? Hepatitis B If you have a higher risk for hepatitis B, you should be screened for this virus. Talk with your health care provider to find out if you are at risk for hepatitis B infection. Hepatitis C Testing is recommended for:  Everyone born from [birth year] through 1967.  Anyone with known risk factors for hepatitis C. Sexually transmitted infections (STIs)  Get screened for STIs, including gonorrhea and chlamydia, if: ? You are sexually active and are younger than 26 years of age. ? You are older than 26 years of age and your health care provider tells you that you are at risk for this type of infection. ? Your sexual activity has changed since you were last screened, and you are at increased risk for chlamydia or gonorrhea. Ask your health care provider if you are at risk.  Ask your health care provider about whether you are at high risk for HIV. Your health care provider may recommend a prescription medicine to help prevent HIV infection. If you choose to take medicine to prevent HIV, you should first get tested for HIV. You should then be tested every 3  months for as long as you are taking the medicine. Pregnancy  If you are about to stop having your period (premenopausal) and you may become pregnant, seek counseling before you get pregnant.  Take 400 to 800 micrograms (mcg) of folic acid every day if you become pregnant.  Ask for birth control (contraception) if you want to prevent pregnancy. Osteoporosis and menopause Osteoporosis is a disease in which the bones lose minerals and strength with aging. This can result in bone fractures. If you are 2 years old or older, or if you are at risk for osteoporosis and fractures, ask your health care provider if you should:  Be screened for bone loss.  Take a calcium or vitamin D supplement to lower your  risk of fractures.  Be given hormone replacement therapy (HRT) to treat symptoms of menopause. Follow these instructions at home: Lifestyle  Do not use any products that contain nicotine or tobacco, such as cigarettes, e-cigarettes, and chewing tobacco. If you need help quitting, ask your health care provider.  Do not use street drugs.  Do not share needles.  Ask your health care provider for help if you need support or information about quitting drugs. Alcohol use  Do not drink alcohol if: ? Your health care provider tells you not to drink. ? You are pregnant, may be pregnant, or are planning to become pregnant.  If you drink alcohol: ? Limit how much you use to 0-1 drink a day. ? Limit intake if you are breastfeeding.  Be aware of how much alcohol is in your drink. In the [place] one drink equals one 12 oz bottle of beer (355 mL), one 5 oz glass of wine (148 mL), or one 1 oz glass of hard liquor (44 mL). General instructions  Schedule regular health, dental, and eye exams.  Stay current with your vaccines.  Tell your health care provider if: ? You often feel depressed. ? You have ever been abused or do not feel safe at home. Summary  Adopting a healthy lifestyle and getting  preventive care are important in promoting health and wellness.  Follow your health care provider's instructions about healthy diet, exercising, and getting tested or screened for diseases.  Follow your health care provider's instructions on monitoring your cholesterol and blood pressure. This information is not intended to replace advice given to you by your health care provider. Make sure you discuss any questions you have with your health care provider. Document Revised: 01/23/2018 Document Reviewed: 01/23/2018 Elsevier Patient Education  2020 [employer].

## 2019-07-01 LAB — CBC WITH DIFFERENTIAL/PLATELET
Basophils Absolute: 0 10*3/uL (ref 0.0–0.2)
Basos: 1 %
EOS (ABSOLUTE): 0.2 10*3/uL (ref 0.0–0.4)
Eos: 3 %
Hematocrit: 38.5 % (ref 34.0–46.6)
Hemoglobin: 13.1 g/dL (ref 11.1–15.9)
Immature Grans (Abs): 0 10*3/uL (ref 0.0–0.1)
Immature Granulocytes: 0 %
Lymphocytes Absolute: 1.9 10*3/uL (ref 0.7–3.1)
Lymphs: 35 %
MCH: 31.5 pg (ref 26.6–33.0)
MCHC: 34 g/dL (ref 31.5–35.7)
MCV: 93 fL (ref 79–97)
Monocytes Absolute: 0.5 10*3/uL (ref 0.1–0.9)
Monocytes: 8 %
Neutrophils Absolute: 2.9 10*3/uL (ref 1.4–7.0)
Neutrophils: 53 %
Platelets: 189 10*3/uL (ref 150–450)
RBC: 4.16 x10E6/uL (ref 3.77–5.28)
RDW: 12.1 % (ref 11.7–15.4)
WBC: 5.5 10*3/uL (ref 3.4–10.8)

## 2019-07-01 LAB — COMPREHENSIVE METABOLIC PANEL
ALT: 12 IU/L (ref 0–32)
AST: 16 IU/L (ref 0–40)
Albumin/Globulin Ratio: 2.4 — ABNORMAL HIGH (ref 1.2–2.2)
Albumin: 4.7 g/dL (ref 3.9–5.0)
Alkaline Phosphatase: 57 IU/L (ref 48–121)
BUN/Creatinine Ratio: 12 (ref 9–23)
BUN: 9 mg/dL (ref 6–20)
Bilirubin Total: 1.2 mg/dL (ref 0.0–1.2)
CO2: 20 mmol/L (ref 20–29)
Calcium: 9 mg/dL (ref 8.7–10.2)
Chloride: 104 mmol/L (ref 96–106)
Creatinine, Ser: 0.74 mg/dL (ref 0.57–1.00)
GFR calc Af Amer: 129 mL/min/{1.73_m2} (ref >59)
GFR calc non Af Amer: 112 mL/min/{1.73_m2} (ref >59)
Globulin, Total: 2 g/dL (ref 1.5–4.5)
Glucose: 78 mg/dL (ref 65–99)
Potassium: 4.4 mmol/L (ref 3.5–5.2)
Sodium: 138 mmol/L (ref 134–144)
Total Protein: 6.7 g/dL (ref 6.0–8.5)

## 2019-07-01 LAB — LIPID PANEL
Chol/HDL Ratio: 3.4 ratio (ref 0.0–4.4)
Cholesterol, Total: 186 mg/dL (ref 100–199)
HDL: 54 mg/dL (ref >39)
LDL Chol Calc (NIH): 119 mg/dL — ABNORMAL HIGH (ref 0–99)
Triglycerides: 69 mg/dL (ref 0–149)
VLDL Cholesterol Cal: 13 mg/dL (ref 5–40)

## 2019-07-01 LAB — TSH: TSH: 1.83 u[IU]/mL (ref 0.450–4.500)

## 2019-07-01 LAB — VITAMIN D 25 HYDROXY (VIT D DEFICIENCY, FRACTURES): Vit D, 25-Hydroxy: 40.3 ng/mL (ref 30.0–100.0)

## 2019-07-01 NOTE — Progress Notes (Signed)
CBC is within normal limits, no signs of infection or anemia.  CMP within normal,  electrolytes, kidney and liver function.  TSH for thyroid function is within normal.  Vitamin D 3 is within normal limits.  Message sent to Mychart

## 2019-07-18 ENCOUNTER — Encounter: Payer: Self-pay | Admitting: Adult Health

## 2019-07-30 ENCOUNTER — Encounter: Payer: BC Managed Care – PPO | Admitting: Certified Nurse Midwife

## 2019-09-30 ENCOUNTER — Telehealth: Payer: Self-pay | Admitting: Adult Health

## 2020-01-20 ENCOUNTER — Encounter: Payer: Self-pay | Admitting: Surgical

## 2020-06-22 ENCOUNTER — Telehealth: Payer: Self-pay

## 2020-06-22 NOTE — Telephone Encounter (Signed)
Attempted to reach patient but call was unable to go through.  For a CPE there is a physical exam.  The provider will need to do a skin check abdominal check etc.  If she sees gyn she will not need to have breast or pelvic.  If patient calls back, [facility] may give message  Copied from CRM [id number]. Topic: Appointment Scheduling - Scheduling Inquiry for Clinic >> Jun 16, 2020  4:58 PM Randol Kern wrote: Reason for CRM: Pt scheduled appt for next available CPE with Maurine Minister Chrismon because Dr. Senaida Lange next available is December... She wants to have a CPE but wants to know if any physical examinations are included? She would prefer a woman to examine her physically. But she is fine if there is not any physical examination. >> Jun 17, 2020  9:52 AM Milas Kocher wrote: Patient see's GYN so all things like breast and pelvic exam for physical would be done at GYN office correct?

## 2020-08-03 ENCOUNTER — Ambulatory Visit (INDEPENDENT_AMBULATORY_CARE_PROVIDER_SITE_OTHER): Payer: 59 | Admitting: Certified Nurse Midwife

## 2020-08-03 ENCOUNTER — Other Ambulatory Visit: Payer: Self-pay

## 2020-08-03 ENCOUNTER — Ambulatory Visit
Admission: RE | Admit: 2020-08-03 | Discharge: 2020-08-03 | Disposition: A | Discharge status: Home / Self Care | Payer: 59 | Attending: Family Medicine | Admitting: Family Medicine

## 2020-08-03 ENCOUNTER — Ambulatory Visit
Admission: RE | Admit: 2020-08-03 | Discharge: 2020-08-03 | Disposition: A | Discharge status: Home / Self Care | Payer: 59 | Source: Ambulatory Visit | Attending: Family Medicine | Admitting: Family Medicine

## 2020-08-03 ENCOUNTER — Encounter: Payer: Self-pay | Admitting: Family Medicine

## 2020-08-03 ENCOUNTER — Ambulatory Visit (INDEPENDENT_AMBULATORY_CARE_PROVIDER_SITE_OTHER): Payer: 59 | Admitting: Family Medicine

## 2020-08-03 ENCOUNTER — Encounter: Payer: Self-pay | Admitting: Certified Nurse Midwife

## 2020-08-03 VITALS — BP 123/80 | HR 69 | Ht 63.0 in | Wt 146.0 lb

## 2020-08-03 VITALS — BP 122/82 | HR 91 | Ht 64.0 in | Wt 143.0 lb

## 2020-08-03 DIAGNOSIS — Z01419 Encounter for gynecological examination (general) (routine) without abnormal findings: Secondary | ICD-10-CM | POA: Diagnosis not present

## 2020-08-03 DIAGNOSIS — M79671 Pain in right foot: Secondary | ICD-10-CM

## 2020-08-03 DIAGNOSIS — Z0000 Encounter for general adult medical examination without abnormal findings: Secondary | ICD-10-CM | POA: Diagnosis not present

## 2020-08-03 DIAGNOSIS — Z1159 Encounter for screening for other viral diseases: Secondary | ICD-10-CM | POA: Diagnosis not present

## 2020-08-03 DIAGNOSIS — Z8639 Personal history of other endocrine, nutritional and metabolic disease: Secondary | ICD-10-CM

## 2020-08-03 DIAGNOSIS — Z114 Encounter for screening for human immunodeficiency virus [HIV]: Secondary | ICD-10-CM

## 2020-08-03 DIAGNOSIS — Z8249 Family history of ischemic heart disease and other diseases of the circulatory system: Secondary | ICD-10-CM

## 2020-08-03 DIAGNOSIS — Z23 Encounter for immunization: Secondary | ICD-10-CM | POA: Diagnosis not present

## 2020-08-03 DIAGNOSIS — R69 Illness, unspecified: Secondary | ICD-10-CM | POA: Diagnosis not present

## 2020-08-03 MED ORDER — TETANUS-DIPHTH-ACELL PERTUSSIS 5-2.5-18.5 LF-MCG/0.5 IM SUSY
0.5000 mL | PREFILLED_SYRINGE | Freq: Once | INTRAMUSCULAR | Status: AC
  Administered 2020-08-03: 0.5 mL via INTRAMUSCULAR

## 2020-08-03 NOTE — Patient Instructions (Signed)
Raynaud Phenomenon  Raynaud phenomenon is a condition that affects the blood vessels (arteries) that carry blood to your fingers and toes. The arteries that supply blood to your ears, lips, nipples, or the tip of your nose might also be affected. Raynaud phenomenon causes the arteries to become narrow temporarily (spasm). As a result, the flow of blood to the affected areas is temporarily decreased. This usually occurs in response to cold temperatures or stress. During an attack, the skin in the affected areas turns white, then blue, andfinally red. You may also feel tingling or numbness in those areas. Attacks usually last for only a brief period, and then the blood flow to the area returns to normal. In most cases, Raynaud phenomenon does not causeserious health problems. What are the causes? In many cases, the cause of this condition is not known. The condition may occur on its own (primary Raynaud phenomenon) or may be associated with other diseases or factors (secondary Raynaud phenomenon). Possible causes may include: Diseases or medical conditions that damage the arteries. Injuries and repetitive actions that hurt the hands or feet. Being exposed to certain chemicals. Taking medicines that narrow the arteries. Other medical conditions, such as lupus, scleroderma, rheumatoid arthritis, thyroid problems, blood disorders, Sjogren syndrome, or atherosclerosis. What increases the risk? The following factors may make you more likely to develop this condition: Being 20-40 years old. Being female. Having a family history of Raynaud phenomenon. Living in a cold climate. Smoking. What are the signs or symptoms? Symptoms of this condition usually occur when you are exposed to cold temperatures or when you have emotional stress. The symptoms may last for a few minutes or up to several hours. They usually affect your fingers but may also affect your toes, nipples, lips, ears, or the tip of your nose.  Symptoms may include: Changes in skin color. The skin in the affected areas will turn pale or white. The skin may then change from white to bluish to red as normal blood flow returns to the area. Numbness, tingling, or pain in the affected areas. In severe cases, symptoms may include: Skin sores. Tissues decaying and dying (gangrene). How is this diagnosed? This condition may be diagnosed based on: Your symptoms and medical history. A physical exam. During the exam, you may be asked to put your hands in cold water to check for a reaction to cold temperature. Tests, such as: Blood tests to check for other diseases or conditions. A test to check the movement of blood through your arteries and veins (vascular ultrasound). A test in which the skin at the base of your fingernail is examined under a microscope (nailfold capillaroscopy). How is this treated? Treatment for this condition often involves making lifestyle changes and taking steps to control your exposure to cold temperatures. For more severe cases, medicine (calcium channel blockers) may be used to improve blood flow. Surgery is sometimes done to block thenerves that control the affected arteries, but this is rare. Follow these instructions at home: Avoiding cold temperatures Take these steps to avoid exposure to cold: If possible, stay indoors during cold weather. When you go outside during cold weather, dress in layers and wear mittens, a hat, a scarf, and warm footwear. Wear mittens or gloves when handling ice or frozen food. Use holders for glasses or cans containing cold drinks. Let warm water run for a while before taking a shower or bath. Warm up the car before driving in cold weather. Lifestyle If possible, avoid stressful and   emotional situations. Try to find ways to manage your stress, such as: Exercise. Yoga. Meditation. Biofeedback. Do not use any products that contain nicotine or tobacco, such as cigarettes and  e-cigarettes. If you need help quitting, ask your health care provider. Avoid secondhand smoke. Limit your use of caffeine. Switch to decaffeinated coffee, tea, and soda. Avoid chocolate. Avoid vibrating tools and machinery. General instructions Protect your hands and feet from injuries, cuts, or bruises. Avoid wearing tight rings or wristbands. Wear loose fitting socks and comfortable, roomy shoes. Take over-the-counter and prescription medicines only as told by your health care provider. Contact a health care provider if: Your discomfort becomes worse despite lifestyle changes. You develop sores on your fingers or toes that do not heal. Your fingers or toes turn black. You have breaks in the skin on your fingers or toes. You have a fever. You have pain or swelling in your joints. You have a rash. Your symptoms occur on only one side of your body. Summary Raynaud phenomenon is a condition that affects the arteries that carry blood to your fingers, toes, ears, lips, nipples, or the tip of your nose. In many cases, the cause of this condition is not known. Symptoms of this condition include changes in skin color, and numbness and tingling of the affected area. Treatment for this condition includes lifestyle changes, reducing exposure to cold temperatures, and using medicines for severe cases of the condition. Contact your health care provider if your condition worsens despite treatment. This information is not intended to replace advice given to you by your health care provider. Make sure you discuss any questions you have with your healthcare provider. Document Revised: 05/13/2019 Document Reviewed: 06/12/2019 Elsevier Patient Education  2022 [employer]  

## 2020-08-03 NOTE — Progress Notes (Signed)
GYNECOLOGY ANNUAL PREVENTATIVE CARE ENCOUNTER NOTE  History:     Connie Martinez is a 25 y.o. G0P0000 female here for a routine annual gynecologic exam.  Current complaints: is interested in potentially have a tubal ligation once she is due for the IUD to come out. .   Denies abnormal vaginal bleeding, discharge, pelvic pain, problems with intercourse or other gynecologic concerns.     Social Relationship: married  Living:with her spouse  Work: Designer, television/film set Exercise: walks 5 times wk/stretches 3 times wk.  Smoke/Alcohol/drug use: denies use   Gynecologic History No LMP recorded (lmp unknown). (Menstrual status: IUD). Contraception: IUD Last Pap: 07/29/2018. Results were: normal Last mammogram: n/a    Upstream - 08/03/20 1313       Pregnancy Intention Screening   Does the patient want to become pregnant in the next year? No    Does the patient's partner want to become pregnant in the next year? No    Would the patient like to discuss contraceptive options today? No      Contraception Wrap Up   Current Method IUD or IUS    End Method IUD or IUS    Contraception Counseling Provided No            The pregnancy intention screening data noted above was reviewed. Potential methods of contraception were discussed. The patient elected to proceed with IUD or IUS.   Obstetric History OB History  Gravida Para Term Preterm AB Living  0 0 0 0 0 0  SAB IAB Ectopic Multiple Live Births  0 0 0 0 0    Past Medical History:  Diagnosis Date   Allergy    Anemia    Anxiety    Iron deficiency    Seasonal allergies    Vitamin D deficiency     Past Surgical History:  Procedure Laterality Date   WISDOM TOOTH EXTRACTION      Current Outpatient Medications on File Prior to Visit  Medication Sig Dispense Refill   cetirizine (ZYRTEC) 10 MG tablet Take 10 mg by mouth daily.     dimenhyDRINATE (DRAMAMINE) 50 MG tablet Take 50 mg by mouth every 8  (eight) hours as needed (for nausea).     ferrous sulfate 325 (65 FE) MG tablet Take 325 mg by mouth daily with breakfast.     levonorgestrel (MIRENA) 20 MCG/24HR IUD 1 each by Intrauterine route once.     ASHWAGANDHA PO Take by mouth. (Patient not taking: No sig reported)     Cranberry-Vitamin C-Vitamin E (CRANBERRY PLUS VITAMIN C) 4200-20-3 MG-MG-UNIT CAPS Take by mouth once. (Patient not taking: No sig reported)     Vitamin D, Ergocalciferol, (DRISDOL) 50000 UNITS CAPS Take 50,000 Units by mouth every 7 (seven) days. (Patient not taking: No sig reported)     No current facility-administered medications on file prior to visit.    No Known Allergies  Social History:  reports that she has never smoked. She has never used smokeless tobacco. She reports that she does not drink alcohol and does not use drugs.  Family History  Problem Relation Age of Onset   Thyroid disease Mother    Hypertension Mother    Hyperlipidemia Mother    Thyroid disease Father    Sleep apnea Father    Hypertension Father    Hyperlipidemia Maternal Grandmother    Arthritis Maternal Grandmother    Hypertension Maternal Grandmother    Arthritis Maternal Grandfather  Diabetes Paternal Grandmother    COPD Paternal Grandmother     The following portions of the patient's history were reviewed and updated as appropriate: allergies, current medications, past family history, past medical history, past social history, past surgical history and problem list.  Review of Systems Pertinent items noted in HPI and remainder of comprehensive ROS otherwise negative.  Physical Exam:  BP 122/82   Pulse 91   Ht 5\' 4"  (1.626 m)   Wt 143 lb (64.9 kg)   LMP  (LMP Unknown)   BMI 24.55 kg/m  CONSTITUTIONAL: Well-developed, well-nourished female in no acute distress.  HENT:  Normocephalic, atraumatic, External right and left ear normal. Oropharynx is clear and moist EYES: Conjunctivae and EOM are normal. Pupils are equal,  round, and reactive to light. No scleral icterus.  NECK: Normal range of motion, supple, no masses.  Normal thyroid.  SKIN: Skin is warm and dry. No rash noted. Not diaphoretic. No erythema. No pallor. MUSCULOSKELETAL: Normal range of motion. No tenderness.  No cyanosis, clubbing, or edema.  2+ distal pulses. NEUROLOGIC: Alert and oriented to person, place, and time. Normal reflexes, muscle tone coordination.  PSYCHIATRIC: Normal mood and affect. Normal behavior. Normal judgment and thought content. CARDIOVASCULAR: Normal heart rate noted, regular rhythm RESPIRATORY: Clear to auscultation bilaterally. Effort and breath sounds normal, no problems with respiration noted. BREASTS: Symmetric in size. No masses, tenderness, skin changes, nipple drainage, or lymphadenopathy bilaterally.  ABDOMEN: Soft, no distention noted.  No tenderness, rebound or guarding.  PELVIC: Normal appearing external genitalia and urethral meatus; normal appearing vaginal mucosa and cervix.  No abnormal discharge noted.  Pap smear not due.  IUD strings present.  Normal uterine size, no other palpable masses, no uterine or adnexal tenderness.  .   Assessment and Plan:    1. Women's annual routine gynecological examination  Pap: not due Mammogram : n/a  Labs: Hep C/HIV Refills/orders: Tdap Referral: none, discussed when she is ready to have BTL to make appointment with MD for consult.  Routine preventative health maintenance measures emphasized. Please refer to After Visit Summary for other counseling recommendations.      , CNM [facility],  [facility]
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
## 2020-08-03 NOTE — Progress Notes (Signed)
Pt presents for annual, not due for a pap and verbalizes no concerns.

## 2020-08-04 ENCOUNTER — Encounter: Payer: Self-pay | Admitting: Certified Nurse Midwife

## 2020-08-04 LAB — HIV ANTIBODY (ROUTINE TESTING W REFLEX): HIV Screen 4th Generation wRfx: NONREACTIVE

## 2020-08-04 LAB — HEPATITIS C ANTIBODY: Hep C Virus Ab: <0.1 {s_co_ratio} (ref 0.0–0.9)

## 2020-08-06 DIAGNOSIS — Z8249 Family history of ischemic heart disease and other diseases of the circulatory system: Secondary | ICD-10-CM | POA: Diagnosis not present

## 2020-08-06 DIAGNOSIS — Z0000 Encounter for general adult medical examination without abnormal findings: Secondary | ICD-10-CM | POA: Diagnosis not present

## 2020-08-06 DIAGNOSIS — M79671 Pain in right foot: Secondary | ICD-10-CM | POA: Diagnosis not present

## 2020-08-06 DIAGNOSIS — Z8639 Personal history of other endocrine, nutritional and metabolic disease: Secondary | ICD-10-CM | POA: Diagnosis not present

## 2020-08-07 LAB — CBC WITH DIFFERENTIAL/PLATELET
Basophils Absolute: 0 10*3/uL (ref 0.0–0.2)
Basos: 1 %
EOS (ABSOLUTE): 0.2 10*3/uL (ref 0.0–0.4)
Eos: 4 %
Hematocrit: 39.3 % (ref 34.0–46.6)
Hemoglobin: 13.1 g/dL (ref 11.1–15.9)
Immature Grans (Abs): 0 10*3/uL (ref 0.0–0.1)
Immature Granulocytes: 0 %
Lymphocytes Absolute: 1.6 10*3/uL (ref 0.7–3.1)
Lymphs: 35 %
MCH: 30.6 pg (ref 26.6–33.0)
MCHC: 33.3 g/dL (ref 31.5–35.7)
MCV: 92 fL (ref 79–97)
Monocytes Absolute: 0.5 10*3/uL (ref 0.1–0.9)
Monocytes: 10 %
Neutrophils Absolute: 2.4 10*3/uL (ref 1.4–7.0)
Neutrophils: 50 %
Platelets: 185 10*3/uL (ref 150–450)
RBC: 4.28 x10E6/uL (ref 3.77–5.28)
RDW: 12.7 % (ref 11.7–15.4)
WBC: 4.7 10*3/uL (ref 3.4–10.8)

## 2020-08-07 LAB — COMPREHENSIVE METABOLIC PANEL
ALT: 9 IU/L (ref 0–32)
AST: 15 IU/L (ref 0–40)
Albumin/Globulin Ratio: 2 (ref 1.2–2.2)
Albumin: 4.5 g/dL (ref 3.9–5.0)
Alkaline Phosphatase: 53 IU/L (ref 44–121)
BUN/Creatinine Ratio: 8 — ABNORMAL LOW (ref 9–23)
BUN: 6 mg/dL (ref 6–20)
Bilirubin Total: 1.1 mg/dL (ref 0.0–1.2)
CO2: 22 mmol/L (ref 20–29)
Calcium: 9 mg/dL (ref 8.7–10.2)
Chloride: 105 mmol/L (ref 96–106)
Creatinine, Ser: 0.75 mg/dL (ref 0.57–1.00)
Globulin, Total: 2.2 g/dL (ref 1.5–4.5)
Glucose: 91 mg/dL (ref 65–99)
Potassium: 4.8 mmol/L (ref 3.5–5.2)
Sodium: 141 mmol/L (ref 134–144)
Total Protein: 6.7 g/dL (ref 6.0–8.5)
eGFR: 112 mL/min/{1.73_m2} (ref >59)

## 2020-08-07 LAB — LIPID PANEL
Chol/HDL Ratio: 3.5 ratio (ref 0.0–4.4)
Cholesterol, Total: 199 mg/dL (ref 100–199)
HDL: 57 mg/dL (ref >39)
LDL Chol Calc (NIH): 132 mg/dL — ABNORMAL HIGH (ref 0–99)
Triglycerides: 54 mg/dL (ref 0–149)
VLDL Cholesterol Cal: 10 mg/dL (ref 5–40)

## 2020-08-07 LAB — IRON: Iron: 107 ug/dL (ref 27–159)

## 2020-08-07 LAB — ANA W/REFLEX: Anti Nuclear Antibody (ANA): NEGATIVE

## 2020-08-07 LAB — TSH: TSH: 1.25 u[IU]/mL (ref 0.450–4.500)

## 2020-08-07 LAB — SEDIMENTATION RATE: Sed Rate: 2 mm/hr (ref 0–32)

## 2021-04-21 ENCOUNTER — Ambulatory Visit: Payer: 59 | Admitting: Physician Assistant

## 2021-04-21 ENCOUNTER — Other Ambulatory Visit (HOSPITAL_COMMUNITY)
Admission: RE | Admit: 2021-04-21 | Discharge: 2021-04-21 | Disposition: A | Discharge status: Home / Self Care | Payer: 59 | Source: Ambulatory Visit | Attending: Physician Assistant | Admitting: Physician Assistant

## 2021-04-21 ENCOUNTER — Other Ambulatory Visit: Payer: Self-pay

## 2021-04-21 ENCOUNTER — Encounter: Payer: Self-pay | Admitting: Physician Assistant

## 2021-04-21 VITALS — BP 117/78 | HR 75 | Temp 97.7°F | Resp 16 | Ht 63.0 in | Wt 144.0 lb

## 2021-04-21 DIAGNOSIS — R300 Dysuria: Secondary | ICD-10-CM

## 2021-04-21 DIAGNOSIS — N898 Other specified noninflammatory disorders of vagina: Secondary | ICD-10-CM

## 2021-04-21 LAB — POCT URINALYSIS DIPSTICK
Bilirubin, UA: NEGATIVE
Blood, UA: NEGATIVE
Glucose, UA: NEGATIVE
Ketones, UA: NEGATIVE
Leukocytes, UA: NEGATIVE
Nitrite, UA: NEGATIVE
Protein, UA: NEGATIVE
Spec Grav, UA: 1.01 (ref 1.010–1.025)
Urobilinogen, UA: 0.2 E.U./dL
pH, UA: 6 (ref 5.0–8.0)

## 2021-04-21 NOTE — Progress Notes (Signed)
?  ? ?I,Joseline E Rosas,acting as a scribe for [employer], PA-C.,have documented all relevant documentation on the behalf of [name], PA-C,as directed by  [employer], PA-C while in the presence of Aneisa Karren E Lenna Hagarty, PA-C.  ? ?Established patient visit ? ? ?Patient: Connie Martinez   DOB: [birth year]   26 y.o. Female  MRN: [id number] ?Visit Date: 04/21/2021 ? ?Today's healthcare provider: Dani Gobble Arvis Miguez, PA-C  ?Introduced myself to the patient as a Journalist, newspaper and provided education on APPs in clinical practice.  ? ? ? ? ?Chief Complaint  ?Patient presents with  ? Dysuria  ? ?Subjective  ?  ?Dysuria  ?This is a new problem. The current episode started in the past 7 days. The problem occurs every urination. The problem has been unchanged. The quality of the pain is described as aching. There has been no fever. Associated symptoms include a discharge ("some white discharge") and frequency. Pertinent negatives include no flank pain, hematuria or urgency. Associated symptoms comments: Itching, some lower abdominal pain. She has tried increased fluids (AZO) for the symptoms. The treatment provided no relief.   ? ?States symptoms have been ongoing for about  5 days.  ?States she notices the pain and irritation are a bit worse as she is finishing urinating ?She denies pain with sitting  ?She is in monogamous, long term heterosexual relationship with her husband ?States she has noticed her labia seemed more inflamed and  ?States she has had yeast infections in the past but current symptoms are different ?Reports pruritus, pain and mild discharge.  ? ? ? ?Medications: ?Outpatient Medications Prior to Visit  ?Medication Sig  ? cetirizine (ZYRTEC) 10 MG tablet Take 10 mg by mouth daily.  ? Cranberry-Vitamin C-Vitamin E (CRANBERRY PLUS VITAMIN C) 4200-20-3 MG-MG-UNIT CAPS Take by mouth once.  ? dimenhyDRINATE (DRAMAMINE) 50 MG tablet Take 50 mg by mouth every 8 (eight) hours as needed (for nausea).  ? ferrous sulfate 325 (65 FE) MG  tablet Take 325 mg by mouth daily with breakfast.  ? levonorgestrel (MIRENA) 20 MCG/24HR IUD 1 each by Intrauterine route once.  ? [DISCONTINUED] ASHWAGANDHA PO Take by mouth.  ? [DISCONTINUED] Vitamin D, Ergocalciferol, (DRISDOL) 50000 UNITS CAPS Take 50,000 Units by mouth every 7 (seven) days.  ? ?No facility-administered medications prior to visit.  ? ? ?Review of Systems  ?Genitourinary:  Positive for dysuria and frequency. Negative for flank pain, hematuria, pelvic pain and urgency.  ? ? ?  Objective  ?  ?BP 117/78 (BP Location: Right Arm, Patient Position: Sitting, Cuff Size: Normal)   Pulse 75   Temp 97.7 ?F (36.5 ?C) (Oral)   Resp 16   Ht 5\' 3"  (1.6 m)   Wt 144 lb (65.3 kg)   BMI 25.51 kg/m?  ? ? ?Physical Exam ?Vitals reviewed.  ?Constitutional:   ?   General: She is awake.  ?   Appearance: Normal appearance. She is well-developed and well-groomed.  ?HENT:  ?   Head: Normocephalic and atraumatic.  ?Eyes:  ?   General: Lids are normal.  ?   Extraocular Movements: Extraocular movements intact.  ?   Conjunctiva/sclera: Conjunctivae normal.  ?Pulmonary:  ?   Effort: Pulmonary effort is normal.  ?Musculoskeletal:  ?   Cervical back: Normal range of motion and neck supple.  ?Neurological:  ?   General: No focal deficit present.  ?   Mental Status: She is alert and oriented to person, place, and time.  ?Psychiatric:     ?  Attention and Perception: Attention normal.     ?   Mood and Affect: Mood normal.     ?   Behavior: Behavior normal. Behavior is cooperative.     ?   Thought Content: Thought content normal.     ?   Judgment: Judgment normal.  ?  ? ? ?Results for orders placed or performed in visit on 04/21/21  ?POCT urinalysis dipstick  ?Result Value Ref Range  ? Color, UA Light Yellow   ? Clarity, UA Clear   ? Glucose, UA Negative Negative  ? Bilirubin, UA Negative   ? Ketones, UA Negative   ? Spec Grav, UA 1.010 1.010 - 1.025  ? Blood, UA Negative   ? pH, UA 6.0 5.0 - 8.0  ? Protein, UA Negative  Negative  ? Urobilinogen, UA 0.2 0.2 or 1.0 E.U./dL  ? Nitrite, UA Negative   ? Leukocytes, UA Negative Negative  ? Appearance    ? Odor    ? ? Assessment & Plan  ?  ? ?Problem List Items Addressed This Visit   ?None ? ?Visit Diagnoses   ? ? Dysuria    -  Primary ?Acute, new problem ?Began about 5 days ago, reports pruritis, pain and discomfort at the end of urination ?Reports scant vaginal discharge that is different from her baseline ?Suspect this is likely vaginal yeast infection or BV  given UA results and HPI  ?Recommend she stay well hydrated.  ?Vaginal swab to assist with dx  ? Relevant Orders  ? POCT urinalysis dipstick (Completed)  ? Vaginal pruritus     ?Acute, new problem  ?Began about 5 days ago, reports vaginal pruritis, pain and discomfort at the end of urination ?Reports scant vaginal discharge that is different from her baseline ?Will perform vaginal swab to check for BV, yeast, trichomonas and G/C  ?Recommend only gentle cleanser and warm water to genital area to prevent further irritation, no douches to prevent further pH imbalance.  ?Results to dictate further management  ?Patient is amenable to being called with results and will pick up medications as needed.   ? Relevant Orders  ? Cervicovaginal ancillary only  ? ?  ? ? ? ?No follow-ups on file. ? ? ?I, Staisha Winiarski E Daden Mahany, PA-C, have reviewed all documentation for this visit. The documentation on 04/21/21 for the exam, diagnosis, procedures, and orders are all accurate and complete. ? ? ?Marisa Hufstetler, [place]ie [place] MPH ?[place] ?[facility] ?

## 2021-04-22 LAB — CERVICOVAGINAL ANCILLARY ONLY
Bacterial Vaginitis (gardnerella): NEGATIVE
Candida Glabrata: NEGATIVE
Candida Vaginitis: NEGATIVE
Chlamydia: NEGATIVE
Comment: NEGATIVE
Comment: NEGATIVE
Comment: NEGATIVE
Comment: NEGATIVE
Comment: NEGATIVE
Comment: NORMAL
Neisseria Gonorrhea: NEGATIVE
Trichomonas: NEGATIVE

## 2021-04-26 ENCOUNTER — Other Ambulatory Visit (HOSPITAL_COMMUNITY)
Admission: RE | Admit: 2021-04-26 | Discharge: 2021-04-26 | Disposition: A | Discharge status: Home / Self Care | Payer: 59 | Source: Ambulatory Visit | Attending: Physician Assistant | Admitting: Physician Assistant

## 2021-04-26 ENCOUNTER — Ambulatory Visit: Payer: 59 | Admitting: Physician Assistant

## 2021-04-26 ENCOUNTER — Encounter: Payer: Self-pay | Admitting: Physician Assistant

## 2021-04-26 ENCOUNTER — Other Ambulatory Visit: Payer: Self-pay

## 2021-04-26 VITALS — BP 115/77 | HR 70 | Temp 97.7°F | Resp 16 | Wt 143.3 lb

## 2021-04-26 DIAGNOSIS — N898 Other specified noninflammatory disorders of vagina: Secondary | ICD-10-CM | POA: Diagnosis not present

## 2021-04-26 DIAGNOSIS — R300 Dysuria: Secondary | ICD-10-CM

## 2021-04-26 MED ORDER — FLUCONAZOLE 150 MG PO TABS: 150.0000 mg | ORAL_TABLET | Freq: Once | ORAL | 0 refills | Status: AC

## 2021-04-26 MED ORDER — METRONIDAZOLE 500 MG PO TABS: 500.0000 mg | ORAL_TABLET | Freq: Two times a day (BID) | ORAL | 0 refills | Status: DC

## 2021-04-26 NOTE — Progress Notes (Signed)
?  ? ?I,Joseline E Rosas,acting as a scribe for [employer], PA-C.,have documented all relevant documentation on the behalf of Alithea Lapage E Evaan Tidwell, PA-C,as directed by  [employer], PA-C while in the presence of Farrin Shadle E Natale Barba, PA-C.  ? ?Established patient visit ? ? ?Patient: Connie Martinez   DOB: 24-Oct-1995   25 y.o. Female  MRN: [id number] ?Visit Date: 04/26/2021 ? ?Today's healthcare provider: Oswaldo Conroy Myrlene Riera, PA-C  ?Introduced myself to the patient as a Secondary school teacher and provided education on APPs in clinical practice.  ? ? ?Chief Complaint  ?Patient presents with  ? Gynecologic Exam  ? ?Subjective  ?  ?HPI  ?Patient was seen on 03/09 UA was done which is was normal. A cervicovaginal ancillary was sent off to the lab and came back normal. Patient is here for further evaluation and pelvic exam. ? ? ?States she is still having discomfort in vulvovaginal area which is usually more prevalent when she goes to the restroom ?Discomfort is intermittent in nature ?She denies changes to vaginal discharge or bleeding ?Has not attempted sexual intercourse due to symptoms ? ? ? ?Medications: ?Outpatient Medications Prior to Visit  ?Medication Sig  ? cetirizine (ZYRTEC) 10 MG tablet Take 10 mg by mouth daily.  ? Cranberry-Vitamin C-Vitamin E (CRANBERRY PLUS VITAMIN C) 4200-20-3 MG-MG-UNIT CAPS Take by mouth once.  ? dimenhyDRINATE (DRAMAMINE) 50 MG tablet Take 50 mg by mouth every 8 (eight) hours as needed (for nausea).  ? ferrous sulfate 325 (65 FE) MG tablet Take 325 mg by mouth daily with breakfast.  ? levonorgestrel (MIRENA) 20 MCG/24HR IUD 1 each by Intrauterine route once.  ? ?No facility-administered medications prior to visit.  ? ? ?Review of Systems  ?Constitutional:  Negative for fatigue and fever.  ?Gastrointestinal:  Negative for rectal pain.  ?Genitourinary:  Positive for dysuria and vaginal pain. Negative for flank pain, genital sores, menstrual problem, vaginal bleeding and vaginal discharge.  ?Musculoskeletal:  Negative for  back pain.  ? ? ?  Objective  ?  ?BP 115/77 (BP Location: Left Arm, Patient Position: Sitting, Cuff Size: Normal)   Pulse 70   Temp 97.7 ?F (36.5 ?C) (Temporal)   Resp 16   Wt 143 lb 4.8 oz (65 kg)   BMI 25.38 kg/m?  ? ? ?Physical Exam ?Vitals reviewed. Exam conducted with a chaperone present.  ?Constitutional:   ?   General: She is awake.  ?   Appearance: Normal appearance. She is well-developed, well-groomed and normal weight.  ?HENT:  ?   Head: Normocephalic and atraumatic.  ?Eyes:  ?   Extraocular Movements: Extraocular movements intact.  ?   Conjunctiva/sclera: Conjunctivae normal.  ?   Pupils: Pupils are equal, round, and reactive to light.  ?Pulmonary:  ?   Effort: Pulmonary effort is normal.  ?Genitourinary: ?   General: Normal vulva.  ?   Pubic Area: No rash.   ?   Tanner stage (genital): 5.  ?   Labia:     ?   Right: No rash, tenderness or lesion.     ?   Left: No rash, tenderness or lesion.   ?   Vagina: Vaginal discharge and erythema present.  ?   Cervix: Discharge present. No cervical motion tenderness, erythema or cervical bleeding.  ?   Uterus: Normal.   ?   Comments: Purulent white discharge visualized from cervical os ?IUD strings visualized from os  ?Neurological:  ?   Mental Status: She is alert.  ?  Psychiatric:     ?   Mood and Affect: Mood normal.     ?   Behavior: Behavior normal. Behavior is cooperative.     ?   Thought Content: Thought content normal.     ?   Judgment: Judgment normal.  ?  ? ? ?No results found for any visits on 04/26/21. ? Assessment & Plan  ?  ? ? ? ?Problem List Items Addressed This Visit   ?None ?Visit Diagnoses   ? ? Vaginal pruritus    -  Primary ?Acute, ongoing concern ?Pelvic exam performed today to exclude cyst involvement and discern if issue is more vaginal in nature vs urinary  ?Suspect this may be trichomonas or vaginal yeast infection based on visualized discharge.  ?Will treat for vaginal yeast infection with Diflucan and Flagyl for trichomonas/ BV as  purulent vaginal discharge was visualized at cervical os and in vaginal vaults.  ?Cervicovaginal swab obtained during exam to assist with dx  ?Results to dictate further management ?Follow up as needed for persistent symptoms ?  ? Relevant Medications  ? fluconazole (DIFLUCAN) 150 MG tablet  ? metroNIDAZOLE (FLAGYL) 500 MG tablet  ? Dysuria     ?Acute, ongoing concern ?Suspect this is more related to irritation from urination rather than UTI-type etiology  ?Pelvic exam with cervicovaginal swab to assist with dx ?  ?   ?   ? Purulent vaginal discharge    ?Acute ongoing problem ?Associated symptoms include dysuria and vaginal discomfort, itching  ?Suspect this may be trichomonas or vaginal yeast infection based on visualized discharge.  ?Will treat for vaginal yeast infection with Diflucan and Flagyl for trichomonas/ BV as purulent vaginal discharge was visualized at cervical os and in vaginal vaults.  ?Cervicovaginal swab obtained during exam to assist with dx  ?Discussed various etiologies of symptoms and management  ?Results to dictate further management ?Follow up as needed for persistent symptoms  ? Relevant Medications  ? fluconazole (DIFLUCAN) 150 MG tablet  ? metroNIDAZOLE (FLAGYL) 500 MG tablet  ? Other Relevant Orders  ? Cervicovaginal ancillary only  ? ?  ? ? ? ?No follow-ups on file. ? ? ?I, Virginie Josten E Sterling Mondo, PA-C, have reviewed all documentation for this visit. The documentation on 04/26/21 for the exam, diagnosis, procedures, and orders are all accurate and complete. ? ? ?Joselinne Lawal, Mirian Mo MPH ?[facility] ?[facility] ? ? ? ? ?

## 2021-04-26 NOTE — Patient Instructions (Addendum)
I am sending in two prescriptions for you  ?One is the Diflucan used to treat vaginal yeast infections ?The other is Flagyl which is used to treat bacterial vaginosis and trichomonas, please do not drink alcohol while taking the Flagyl as it can cause nausea, vomiting  ?We will keep you updated with the results of your swab and any further recommendations that may be needed ? ? ?Thank you for coming in and  I appreciate the opportunity to be involved in your care ? ? ? ?

## 2021-04-27 ENCOUNTER — Telehealth: Payer: Self-pay

## 2021-04-27 NOTE — Telephone Encounter (Signed)
Copied from [place] [phone]. Topic: General - Other ?>> Apr 27, 2021  4:29 PM Yvette Rack wrote: ?Reason for CRM: Pt requests lab results. Cb# [phone] ?

## 2021-04-27 NOTE — Telephone Encounter (Signed)
Specimen was done yesterday.Patient aware still pending. ?

## 2021-04-28 LAB — CERVICOVAGINAL ANCILLARY ONLY
Bacterial Vaginitis (gardnerella): NEGATIVE
Candida Glabrata: NEGATIVE
Candida Vaginitis: NEGATIVE
Chlamydia: NEGATIVE
Comment: NEGATIVE
Comment: NEGATIVE
Comment: NEGATIVE
Comment: NEGATIVE
Comment: NEGATIVE
Comment: NORMAL
Neisseria Gonorrhea: NEGATIVE
Trichomonas: NEGATIVE

## 2021-06-21 DIAGNOSIS — G43909 Migraine, unspecified, not intractable, without status migrainosus: Secondary | ICD-10-CM | POA: Diagnosis not present

## 2021-06-21 DIAGNOSIS — G4489 Other headache syndrome: Secondary | ICD-10-CM | POA: Diagnosis not present

## 2021-08-04 ENCOUNTER — Encounter: Payer: Self-pay | Admitting: Physician Assistant

## 2021-08-04 ENCOUNTER — Ambulatory Visit (INDEPENDENT_AMBULATORY_CARE_PROVIDER_SITE_OTHER): Payer: 59 | Admitting: Physician Assistant

## 2021-08-04 VITALS — BP 102/74 | HR 69 | Ht 63.0 in | Wt 142.7 lb

## 2021-08-04 DIAGNOSIS — E782 Mixed hyperlipidemia: Secondary | ICD-10-CM

## 2021-08-04 DIAGNOSIS — Z0000 Encounter for general adult medical examination without abnormal findings: Secondary | ICD-10-CM

## 2021-08-04 DIAGNOSIS — Z8639 Personal history of other endocrine, nutritional and metabolic disease: Secondary | ICD-10-CM | POA: Diagnosis not present

## 2021-08-04 DIAGNOSIS — L723 Sebaceous cyst: Secondary | ICD-10-CM | POA: Diagnosis not present

## 2021-08-04 DIAGNOSIS — E559 Vitamin D deficiency, unspecified: Secondary | ICD-10-CM

## 2021-08-04 NOTE — Assessment & Plan Note (Signed)
Historically, takes supplement. Will recheck

## 2021-08-04 NOTE — Assessment & Plan Note (Signed)
Occipital region, hairline. Small. Advised if bothersome, ref to derm. In the meantime if larger or irritated can use warm compresses

## 2021-08-04 NOTE — Assessment & Plan Note (Signed)
Historically, takes supplement will recheck

## 2021-08-08 DIAGNOSIS — E782 Mixed hyperlipidemia: Secondary | ICD-10-CM | POA: Diagnosis not present

## 2021-08-08 DIAGNOSIS — E559 Vitamin D deficiency, unspecified: Secondary | ICD-10-CM | POA: Diagnosis not present

## 2021-08-08 DIAGNOSIS — Z8639 Personal history of other endocrine, nutritional and metabolic disease: Secondary | ICD-10-CM | POA: Diagnosis not present

## 2021-08-08 DIAGNOSIS — Z0000 Encounter for general adult medical examination without abnormal findings: Secondary | ICD-10-CM | POA: Diagnosis not present

## 2021-08-09 LAB — COMPREHENSIVE METABOLIC PANEL
ALT: 10 IU/L (ref 0–32)
AST: 14 IU/L (ref 0–40)
Albumin/Globulin Ratio: 2.5 — ABNORMAL HIGH (ref 1.2–2.2)
Albumin: 4.8 g/dL (ref 3.9–5.0)
Alkaline Phosphatase: 52 IU/L (ref 44–121)
BUN/Creatinine Ratio: 12 (ref 9–23)
BUN: 9 mg/dL (ref 6–20)
Bilirubin Total: 0.9 mg/dL (ref 0.0–1.2)
CO2: 24 mmol/L (ref 20–29)
Calcium: 9.7 mg/dL (ref 8.7–10.2)
Chloride: 102 mmol/L (ref 96–106)
Creatinine, Ser: 0.73 mg/dL (ref 0.57–1.00)
Globulin, Total: 1.9 g/dL (ref 1.5–4.5)
Glucose: 92 mg/dL (ref 70–99)
Potassium: 4.7 mmol/L (ref 3.5–5.2)
Sodium: 140 mmol/L (ref 134–144)
Total Protein: 6.7 g/dL (ref 6.0–8.5)
eGFR: 115 mL/min/{1.73_m2} (ref >59)

## 2021-08-09 LAB — LIPID PANEL
Chol/HDL Ratio: 3.6 ratio (ref 0.0–4.4)
Cholesterol, Total: 207 mg/dL — ABNORMAL HIGH (ref 100–199)
HDL: 58 mg/dL (ref >39)
LDL Chol Calc (NIH): 136 mg/dL — ABNORMAL HIGH (ref 0–99)
Triglycerides: 75 mg/dL (ref 0–149)
VLDL Cholesterol Cal: 13 mg/dL (ref 5–40)

## 2021-08-09 LAB — CBC WITH DIFFERENTIAL/PLATELET
Basophils Absolute: 0 10*3/uL (ref 0.0–0.2)
Basos: 1 %
EOS (ABSOLUTE): 0.1 10*3/uL (ref 0.0–0.4)
Eos: 2 %
Hematocrit: 41.9 % (ref 34.0–46.6)
Hemoglobin: 14.1 g/dL (ref 11.1–15.9)
Immature Grans (Abs): 0 10*3/uL (ref 0.0–0.1)
Immature Granulocytes: 0 %
Lymphocytes Absolute: 1.8 10*3/uL (ref 0.7–3.1)
Lymphs: 38 %
MCH: 31.3 pg (ref 26.6–33.0)
MCHC: 33.7 g/dL (ref 31.5–35.7)
MCV: 93 fL (ref 79–97)
Monocytes Absolute: 0.5 10*3/uL (ref 0.1–0.9)
Monocytes: 9 %
Neutrophils Absolute: 2.4 10*3/uL (ref 1.4–7.0)
Neutrophils: 50 %
Platelets: 198 10*3/uL (ref 150–450)
RBC: 4.5 x10E6/uL (ref 3.77–5.28)
RDW: 12.2 % (ref 11.7–15.4)
WBC: 4.8 10*3/uL (ref 3.4–10.8)

## 2021-08-09 LAB — IRON,TIBC AND FERRITIN PANEL
Ferritin: 269 ng/mL — ABNORMAL HIGH (ref 15–150)
Iron Saturation: 39 % (ref 15–55)
Iron: 101 ug/dL (ref 27–159)
Total Iron Binding Capacity: 262 ug/dL (ref 250–450)
UIBC: 161 ug/dL (ref 131–425)

## 2021-08-09 LAB — TSH+FREE T4
Free T4: 1.24 ng/dL (ref 0.82–1.77)
TSH: 1.77 u[IU]/mL (ref 0.450–4.500)

## 2021-08-09 LAB — VITAMIN D 25 HYDROXY (VIT D DEFICIENCY, FRACTURES): Vit D, 25-Hydroxy: 65.7 ng/mL (ref 30.0–100.0)

## 2021-08-09 LAB — HEMOGLOBIN A1C
Est. average glucose Bld gHb Est-mCnc: 105 mg/dL
Hgb A1c MFr Bld: 5.3 % (ref 4.8–5.6)

## 2021-09-21 DIAGNOSIS — D239 Other benign neoplasm of skin, unspecified: Secondary | ICD-10-CM | POA: Diagnosis not present

## 2021-09-21 DIAGNOSIS — L720 Epidermal cyst: Secondary | ICD-10-CM | POA: Diagnosis not present

## 2023-04-04 IMAGING — CR DG FOOT COMPLETE 3+V*R*
1 series · 3 of 3 positions shown · non-contrast
Comparison: None.

CLINICAL DATA: Pt states no recent injury, pain medial right foot
and numbness in right toes for 6 months. Pain in right foot the past
couple months.

EXAM:
RIGHT FOOT COMPLETE - 3+ VIEW

[Series 1: dg foot complete right · 0.14mm/px · 3 of 3 slices shown]
[im 1/3]
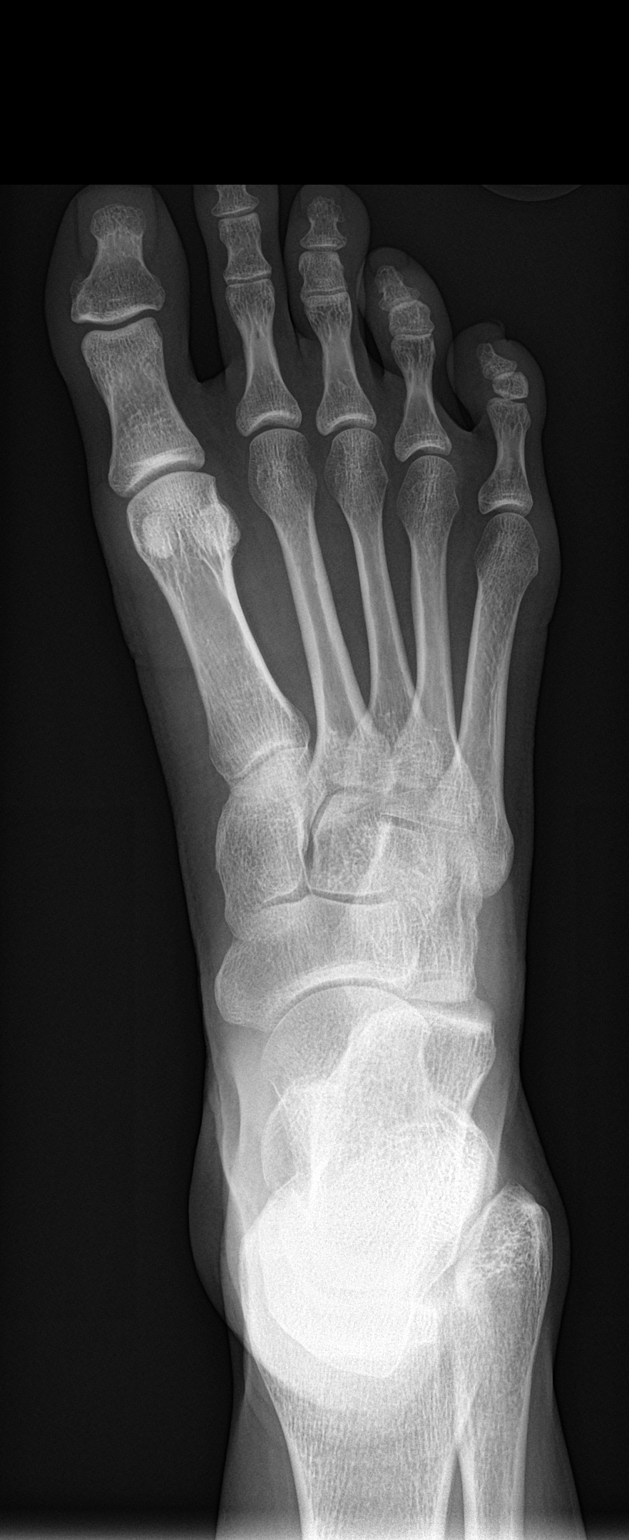
[im 2/3]
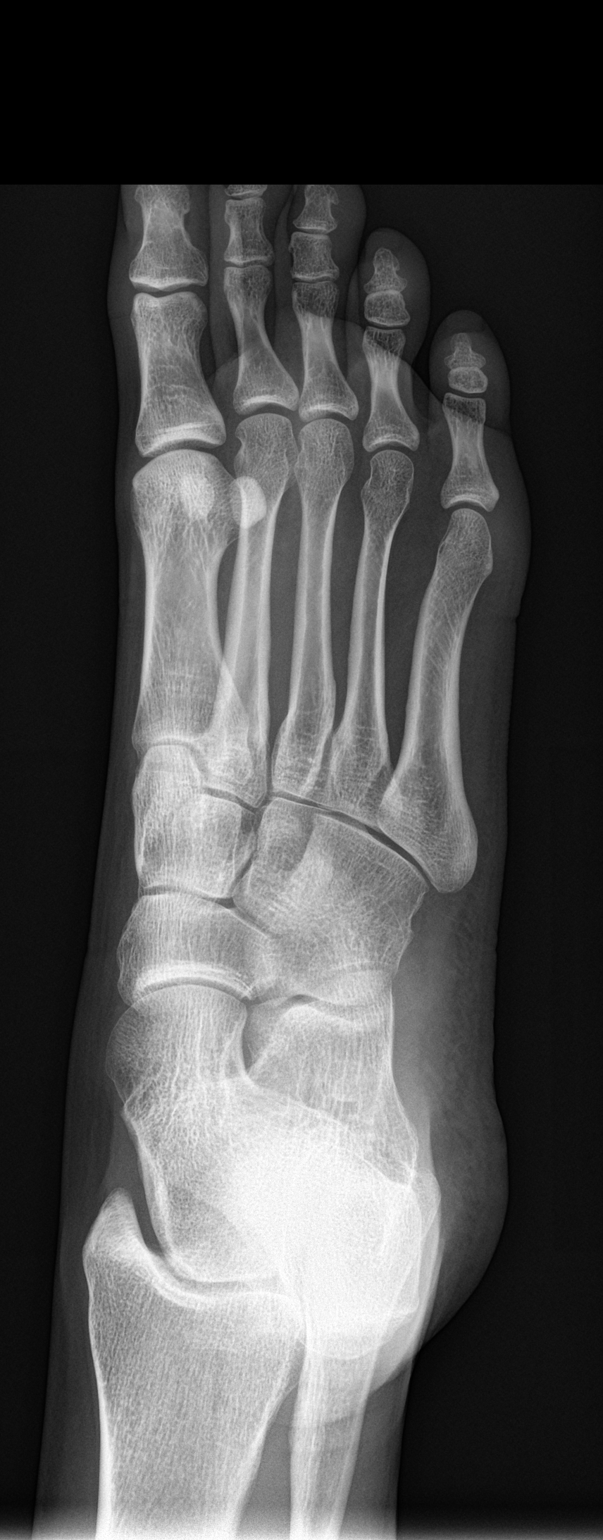
[im 3/3]
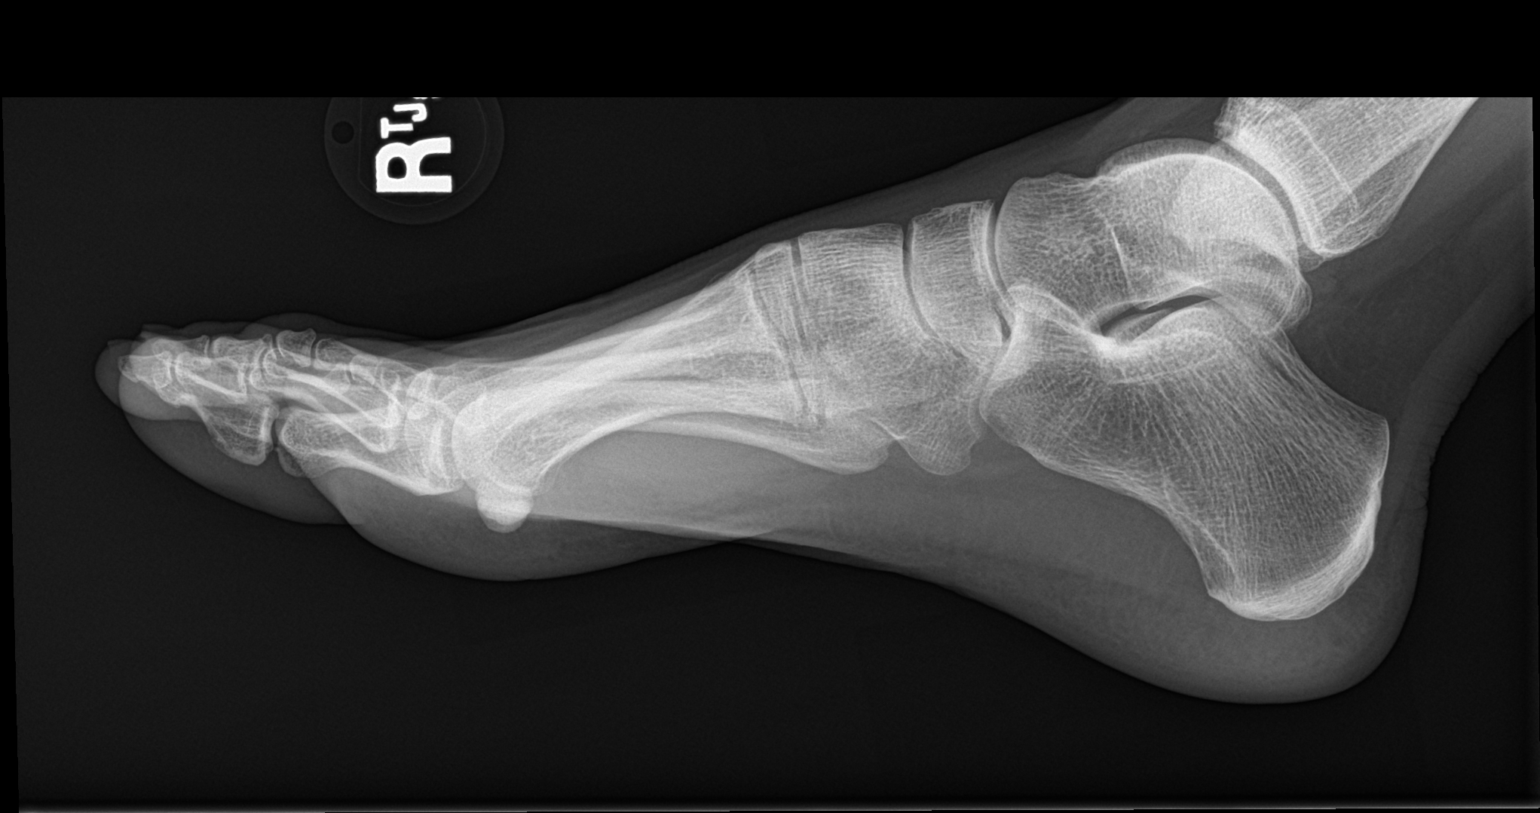

[3 of 3 positions shown; findings below may reference images not displayed]

FINDINGS: There is no evidence of fracture or dislocation. There is no
evidence of arthropathy or other focal bone abnormality. Soft
tissues are unremarkable.
IMPRESSION: Negative.

## 2024-02-20 NOTE — Patient Instructions (Signed)
 Preventive Care 28-31 Years Old, Female  Preventive care refers to lifestyle choices and visits with your health care provider that can promote health and wellness. Preventive care visits are also called wellness exams. What can I expect for my preventive care visit? Counseling During your preventive care visit, your health care provider may ask about your: Medical history, including: Past medical problems. Family medical history. Pregnancy history. Current health, including: Menstrual cycle. Method of birth control. Emotional well-being. Home life and relationship well-being. Sexual activity and sexual health. Lifestyle, including: Alcohol, nicotine or tobacco, and drug use. Access to firearms. Diet, exercise, and sleep habits. Work and work Astronomer. Sunscreen use. Safety issues such as seatbelt and bike helmet use. Physical exam Your health care provider may check your: Height and weight. These may be used to calculate your BMI (body mass index). BMI is a measurement that tells if you are at a healthy weight. Waist circumference. This measures the distance around your waistline. This measurement also tells if you are at a healthy weight and may help predict your risk of certain diseases, such as type 2 diabetes and high blood pressure. Heart rate and blood pressure. Body temperature. Skin for abnormal spots. What immunizations do I need?  Vaccines are usually given at various ages, according to a schedule. Your health care provider will recommend vaccines for you based on your age, medical history, and lifestyle or other factors, such as travel or where you work. What tests do I need? Screening Your health care provider may recommend screening tests for certain conditions. This may include: Pelvic exam and Pap test. Lipid and cholesterol levels. Diabetes screening. This is done by checking your blood sugar (glucose) after you have not eaten for a while  (fasting). Hepatitis B test. Hepatitis C test. HIV (human immunodeficiency virus) test. STI (sexually transmitted infection) testing, if you are at risk. BRCA-related cancer screening. This may be done if you have a family history of breast, ovarian, tubal, or peritoneal cancers. Talk with your health care provider about your test results, treatment options, and if necessary, the need for more tests. Follow these instructions at home: Eating and drinking  Eat a healthy diet that includes fresh fruits and vegetables, whole grains, lean protein, and low-fat dairy products. Take vitamin and mineral supplements as recommended by your health care provider. Do not drink alcohol if: Your health care provider tells you not to drink. You are pregnant, may be pregnant, or are planning to become pregnant. If you drink alcohol: Limit how much you have to 0-1 drink a day. Know how much alcohol is in your drink. In the [place] one drink equals one 12 oz bottle of beer (355 mL), one 5 oz glass of wine (148 mL), or one 1 oz glass of hard liquor (44 mL). Lifestyle Brush your teeth every morning and night with fluoride toothpaste. Floss one time each day. Exercise for at least 30 minutes 5 or more days each week. Do not use any products that contain nicotine or tobacco. These products include cigarettes, chewing tobacco, and vaping devices, such as e-cigarettes. If you need help quitting, ask your health care provider. Do not use drugs. If you are sexually active, practice safe sex. Use a condom or other form of protection to prevent STIs. If you do not wish to become pregnant, use a form of birth control. If you plan to become pregnant, see your health care provider for a prepregnancy visit. Find healthy ways to manage stress, such as:  Meditation, yoga, or listening to music. Journaling. Talking to a trusted person. Spending time with friends and family. Minimize exposure to UV radiation to reduce your  risk of skin cancer. Safety Always wear your seat belt while driving or riding in a vehicle. Do not drive: If you have been drinking alcohol. Do not ride with someone who has been drinking. If you have been using any mind-altering substances or drugs. While texting. When you are tired or distracted. Wear a helmet and other protective equipment during sports activities. If you have firearms in your house, make sure you follow all gun safety procedures. Seek help if you have been physically or sexually abused. What's next? Go to your health care provider once a year for an annual wellness visit. Ask your health care provider how often you should have your eyes and teeth checked. Stay up to date on all vaccines. This information is not intended to replace advice given to you by your health care provider. Make sure you discuss any questions you have with your health care provider. Document Revised: 07/28/2020 Document Reviewed: 07/28/2020 Elsevier Patient Education  2024 Elsevier Inc.     How to Do a Breast Self-Exam Doing breast self-exams can help you stay healthy. They're one way to know what's normal for your breasts. They can help you catch a problem while it's still small and can be treated. You need to: Check your breasts often. Tell your doctor about any changes. You should do breast self-exams even if you have breast implants. What you need: A mirror. A well-lit room. A pillow or other soft object. How to do a breast self-exam Look for changes  Take off all the clothes above your waist. Stand in front of a mirror in a room with good lighting. Put your hands down at your sides. Compare your breasts in the mirror. Look for difference between them, such as: Differences in shape. Differences in size. Wrinkles, dips, and bumps in one breast and not the other. Look at each breast for skin changes, such as: Redness. Scaly spots. Spots where your skin is  thicker. Dimpling. Open sores. Look for changes in your nipples, such as: Fluid coming out of a nipple. Fluid around a nipple. Bleeding. Dimpling. Redness. A nipple that looks pushed in or that has changed position. Feel for changes Lie on your back. Feel each breast. To do this: Pick a breast to feel. Place a pillow under the shoulder closest to that breast. Put the arm closest to that breast behind your head. Feel the breast using the hand of your other arm. Use the pads of your three middle fingers to make small circles starting near the nipple. Use light, medium, and firm pressure. Keep making circles, moving down over the breast. Stop when you feel your ribs. Start making circles with your fingers again, this time going up until you reach your collarbone. Then, make circles out across your breast and into your armpit area. Squeeze your nipple. Check for fluid and lumps. Do these steps again to check your other breast. Sit or stand in the tub or shower. With soapy water on your skin, feel each breast the same way you did when you were lying down. Write down what you find Writing down what you find can help you keep track of what you want to tell your doctor. Write down: What's normal for each breast. Any changes you find. Write down: The kind of change. If your breast feels tender or painful.  Any lump you find. Write down its size and where it is. When you last had your period. General tips If you're breastfeeding, the best time to check your breasts is after you feed your baby or after you use a breast pump. If you get a period, the best time to check your breasts is 5-7 days after your period ends. With time, you'll get more used to doing the self-exam. You'll also start to know if there are changes in your breasts. Contact a doctor if: You see a change in the shape or size of your breasts or nipples. You see a change in the skin of your breast or nipples. You have fluid  coming from your nipples that isn't normal. You find a new lump or thick area. You have breast pain. You have any concerns about your breast health. This information is not intended to replace advice given to you by your health care provider. Make sure you discuss any questions you have with your health care provider. Document Revised: 04/11/2023 Document Reviewed: 04/11/2023 Elsevier Patient Education  2025 [employer].

## 2024-02-20 NOTE — Progress Notes (Signed)
 "        GYNECOLOGY ANNUAL PREVENTATIVE CARE ENCOUNTER NOTE  History:     Connie Martinez is a 29 y.o. No obstetric history on file. female here for a routine annual gynecologic exam.  Current complaints: none.   Denies abnormal vaginal bleeding, discharge, pelvic pain, problems with intercourse or other gynecologic concerns.     Social Relationship:married Living:husband Work:work from home full time Exercise:not active Smoke/Alcohol/drug use:no/never/never  Gynecologic History No LMP recorded. (Menstrual status: IUD). Contraception: IUD Last Pap: 07/29/2018. Results were: normal with negative HPV Last mammogram: N/A. Results were: N/A  Obstetric History OB History  No obstetric history on file.    Past Medical History:  Diagnosis Date   Allergy    Anemia    Anxiety    Dehydration 05/07/2012   Iron deficiency    Nausea & vomiting 05/07/2012   Seasonal allergies    Vitamin D  deficiency     Past Surgical History:  Procedure Laterality Date   WISDOM TOOTH EXTRACTION      Medications Ordered Prior to Encounter[1]  Allergies[2]  Social History:  reports that she has never smoked. She has never used smokeless tobacco. She reports that she does not drink alcohol and does not use drugs.  Family History  Problem Relation Age of Onset   Thyroid  disease Mother    Hypertension Mother    Hyperlipidemia Mother    Thyroid  disease Father    Sleep apnea Father    Hypertension Father    Hyperlipidemia Maternal Grandmother    Arthritis Maternal Grandmother    Hypertension Maternal Grandmother    Arthritis Maternal Grandfather    Diabetes Paternal Grandmother    COPD Paternal Grandmother     The following portions of the patient's history were reviewed and updated as appropriate: allergies, current medications, past family history, past medical history, past social history, past surgical history and problem list.  Review of Systems Pertinent items noted in HPI and  remainder of comprehensive ROS otherwise negative.  Physical Exam:  BP 93/72   Pulse 69   Ht 5' 4 (1.626 m)   Wt 147 lb 11.2 oz (67 kg)   BMI 25.35 kg/m  CONSTITUTIONAL: Well-developed, well-nourished female in no acute distress.  HENT:  Normocephalic, atraumatic, External right and left ear normal. Oropharynx is clear and moist EYES: Conjunctivae and EOM are normal. Pupils are equal, round, and reactive to light. No scleral icterus.  NECK: Normal range of motion, supple, no masses.  Normal thyroid .  SKIN: Skin is warm and dry. No rash noted. Not diaphoretic. No erythema. No pallor. MUSCULOSKELETAL: Normal range of motion. No tenderness.  No cyanosis, clubbing, or edema.  2+ distal pulses. NEUROLOGIC: Alert and oriented to person, place, and time. Normal reflexes, muscle tone coordination.  PSYCHIATRIC: Normal mood and affect. Normal behavior. Normal judgment and thought content. CARDIOVASCULAR: Normal heart rate noted, regular rhythm RESPIRATORY: Clear to auscultation bilaterally. Effort and breath sounds normal, no problems with respiration noted. BREASTS: Symmetric in size. No masses, tenderness, skin changes, nipple drainage, or lymphadenopathy bilaterally.  ABDOMEN: Soft, no distention noted.  No tenderness, rebound or guarding.  PELVIC: Normal appearing external genitalia and urethral meatus; normal appearing vaginal mucosa and cervix.  No abnormal discharge noted.  Pap smear obtained. Strings present.  Normal uterine size, no other palpable masses, no uterine or adnexal tenderness.  .   Assessment and Plan:  Annual well Women Gyn Exam   Pap:Will follow up results of pap smear and  manage accordingly. Mammogram : n/a  Labs: none Refills: none  Referral: none  Routine preventative health maintenance measures emphasized. Please refer to After Visit Summary for other counseling recommendations.      Zelda Hummer, CNM [facility]  [facility],  [facility]      [1]  Current Outpatient Medications on File Prior to Visit  Medication Sig Dispense Refill   cetirizine (ZYRTEC) 10 MG tablet Take 10 mg by mouth daily.     Cranberry-Vitamin C-Vitamin E (CRANBERRY PLUS VITAMIN C) 4200-20-3 MG-MG-UNIT CAPS Take by mouth once.     ferrous sulfate 325 (65 FE) MG tablet Take 325 mg by mouth daily with breakfast.     levonorgestrel  (MIRENA ) 20 MCG/24HR IUD 1 each by Intrauterine route once.     No current facility-administered medications on file prior to visit.  [2] No Known Allergies  "

## 2024-02-21 ENCOUNTER — Other Ambulatory Visit (HOSPITAL_COMMUNITY)
Admission: RE | Admit: 2024-02-21 | Discharge: 2024-02-21 | Disposition: A | Source: Ambulatory Visit | Attending: Certified Nurse Midwife | Admitting: Certified Nurse Midwife

## 2024-02-21 ENCOUNTER — Ambulatory Visit: Admitting: Certified Nurse Midwife

## 2024-02-21 ENCOUNTER — Encounter: Payer: Self-pay | Admitting: Certified Nurse Midwife

## 2024-02-21 VITALS — BP 93/72 | HR 69 | Ht 64.0 in | Wt 147.7 lb

## 2024-02-21 DIAGNOSIS — Z01419 Encounter for gynecological examination (general) (routine) without abnormal findings: Secondary | ICD-10-CM | POA: Diagnosis present

## 2024-02-21 DIAGNOSIS — Z124 Encounter for screening for malignant neoplasm of cervix: Secondary | ICD-10-CM

## 2024-02-26 LAB — CYTOLOGY - PAP
Adequacy: ABSENT
Comment: NEGATIVE
Diagnosis: NEGATIVE
High risk HPV: NEGATIVE
